# Patient Record
Sex: Female | Born: 1980 | Race: White | Hispanic: No | Marital: Married | Smoking: Never smoker
Health system: Southern US, Community
[De-identification: ages and names within clinical notes are randomized; demographics above are authoritative.]

## PROBLEM LIST (undated history)

## (undated) DIAGNOSIS — Z9889 Other specified postprocedural states: Secondary | ICD-10-CM

## (undated) DIAGNOSIS — E079 Disorder of thyroid, unspecified: Secondary | ICD-10-CM

## (undated) DIAGNOSIS — E039 Hypothyroidism, unspecified: Secondary | ICD-10-CM

## (undated) DIAGNOSIS — O24419 Gestational diabetes mellitus in pregnancy, unspecified control: Secondary | ICD-10-CM

## (undated) DIAGNOSIS — R112 Nausea with vomiting, unspecified: Secondary | ICD-10-CM

## (undated) HISTORY — PX: ARM WOUND REPAIR / CLOSURE: SUR1141

## (undated) HISTORY — DX: Disorder of thyroid, unspecified: E07.9

---

## 2008-10-06 ENCOUNTER — Ambulatory Visit (HOSPITAL_COMMUNITY): Admission: EM | Admit: 2008-10-06 | Discharge: 2008-10-07 | Payer: Self-pay | Admitting: Emergency Medicine

## 2009-04-29 ENCOUNTER — Ambulatory Visit: Payer: Self-pay | Admitting: General Practice

## 2009-10-13 ENCOUNTER — Emergency Department (HOSPITAL_BASED_OUTPATIENT_CLINIC_OR_DEPARTMENT_OTHER): Admission: EM | Admit: 2009-10-13 | Discharge: 2009-10-13 | Payer: Self-pay | Admitting: Emergency Medicine

## 2011-02-21 LAB — CBC
MCHC: 34.2 g/dL (ref 30.0–36.0)
Platelets: 243 10*3/uL (ref 150–400)
RBC: 4.66 MIL/uL (ref 3.87–5.11)
WBC: 10.9 10*3/uL — ABNORMAL HIGH (ref 4.0–10.5)

## 2011-02-21 LAB — BASIC METABOLIC PANEL
Calcium: 9.3 mg/dL (ref 8.4–10.5)
Creatinine, Ser: 0.7 mg/dL (ref 0.4–1.2)
GFR calc Af Amer: >60 mL/min (ref >60)
GFR calc non Af Amer: >60 mL/min (ref >60)
Glucose, Bld: 93 mg/dL (ref 70–99)
Sodium: 140 mEq/L (ref 135–145)

## 2011-02-21 LAB — URINALYSIS, ROUTINE W REFLEX MICROSCOPIC
Bilirubin Urine: NEGATIVE
Ketones, ur: NEGATIVE mg/dL
Urobilinogen, UA: 0.2 mg/dL (ref 0.0–1.0)
pH: 5.5 (ref 5.0–8.0)

## 2011-02-21 LAB — DIFFERENTIAL
Basophils Absolute: 0.4 10*3/uL — ABNORMAL HIGH (ref 0.0–0.1)
Basophils Relative: 4 % — ABNORMAL HIGH (ref 0–1)
Lymphocytes Relative: 14 % (ref 12–46)
Neutro Abs: 8.2 10*3/uL — ABNORMAL HIGH (ref 1.7–7.7)

## 2011-02-21 LAB — URINE MICROSCOPIC-ADD ON

## 2011-04-03 NOTE — Op Note (Signed)
Sabrina Richards, Sabrina Richards NO.:  [id number]   MEDICAL RECORD NO.:  [id number]          PATIENT TYPE:  INP   LOCATION:  0098                         FACILITY:  [facility]   PHYSICIAN:  Vanita Panda. Magnus Ivan, M.D.DATE OF BIRTH:  1980-12-20   DATE OF PROCEDURE:  10/06/2008  DATE OF DISCHARGE:                               OPERATIVE REPORT   PREOPERATIVE NOTE:  Right arm multiple, deep, soft tissue wounds, status  post dog bite.   POSTOPERATIVE DIAGNOSIS:  Right arm multiple dog bite, deep, soft tissue  wounds with exposed posterior elbow joint.   PROCEDURE:  1. Irrigation and debridement of multiple, deep, soft tissue wounds,      right arm.  2. Exploration of deep penetrating traumatic wounds, right arm and      elbow.  3. Primary closure of five soft tissue lacerations.   SURGEON:  Doneen Poisson, M.D.   ANESTHESIA:  General.   ANTIBIOTICS:  1. Avelox IV.  2. Clindamycin IV.   Both given in the ER.   BLOOD LOSS:  10 mL.   COMPLICATIONS:  None.   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  She was seen in the emergency room and found to have two  to three large wounds on the anterior medial aspect of the distal  humerus near the elbow joint.  She also had smaller wounds on the  lateral aspect.  Her neurovascular exam was normal.  She did report some  slight numbness in the dorsum of her hand and a cool-feeling hand, but  she had a palpable pulse, well-perfused fingers and normal motor  function.  X-rays were obtained that did show air penetrating all the  way down to the joint and it was recommend  given the nature of her  wounds.  She undergo irrigation debridement with exploration of the  wound, repair of any injured structures and irrigation of the elbow  joint as well.  Risks and benefits of this were explained to her at  length  and she agreed to surgery.   PROCEDURE:  After informed consent was obtained, appropriate right arm  was marked.  She was brought to the operating room, placed supine on the  operating room table.  Her right arm was placed on the arm table.  General anesthesia was then obtained.  Her arm was prepped and draped  from the axilla to the wrist with Betadine scrub and paint.  A sterile  stockinette was utilized as well as sterile drapes.  Time-out was called  to identify the correct patient correct right arm.   I then started first at the large medial wounds.  I could palpate easily  the medial epicondyle and the ulnar nerve.  I could further palpate the  olecranon fossa to find out the joint was exposed.  It was stable  ligamentously.  Next using a soft tissue dissecting scissors,  I removed  necrotic fatty tissue and necrotic skin edges.  I  then used pulsatile  lavage and thoroughly irrigated the deep tissues all around the medial  aspect and posterior aspect of the elbow using 3 liters of normal saline  solution followed by 500 mL of bacitracin solution.  Small wounds on the  lateral aspect of the elbow were likewise irrigated thoroughly with  pulsatile lavage.  I then closed all wounds separately using interrupted  3-0 nylon suture in a loose format to bring the skin edges together to  allow for drainage in case of further infection.  Five wounds total were  closed.  I then placed Xeroform followed by well-padded sterile  dressing, soft Kling and Ace wrap.  The patient's hand remained well-  perfused throughout the case.  At the end of the case, blood loss was 10  mL.  All final counts were correct and there no complications noted.  The patient was awakened, extubated taken to recovery room in stable  condition.   Postoperatively she will be admitted for extended outpatient recovery in  23-hour observation for administration of IV antibiotics before  discharge in transition to oral  antibiotics.  A handwritten history and  exam can also be seen in her chart.      Vanita Panda. Magnus Ivan, M.D.  Electronically Signed     CYB/MEDQ  D:  10/06/2008  T:  10/07/2008  Job:  [id number]

## 2011-08-22 LAB — CBC
MCHC: 34.3
MCV: 93.9
Platelets: 233
RBC: 4.25
WBC: 14 — ABNORMAL HIGH

## 2011-08-22 LAB — COMPREHENSIVE METABOLIC PANEL
ALT: 13
Albumin: 3.2 — ABNORMAL LOW
Calcium: 8.6
Chloride: 108
Creatinine, Ser: 0.65
Total Protein: 6

## 2016-01-31 ENCOUNTER — Encounter: Payer: Self-pay | Admitting: Dietician

## 2016-01-31 ENCOUNTER — Encounter: Payer: 59 | Attending: Advanced Practice Midwife | Admitting: Dietician

## 2016-01-31 VITALS — BP 108/62 | Ht 60.0 in | Wt 263.5 lb

## 2016-01-31 DIAGNOSIS — O24419 Gestational diabetes mellitus in pregnancy, unspecified control: Secondary | ICD-10-CM | POA: Insufficient documentation

## 2016-01-31 DIAGNOSIS — O24410 Gestational diabetes mellitus in pregnancy, diet controlled: Secondary | ICD-10-CM

## 2016-01-31 NOTE — Progress Notes (Signed)
Diabetes Self-Management Education  Visit Type: First/Initial  Appt. Start Time: 1300 Appt. End Time: 1430  01/31/2016  Sabrina Richards, identified by name and date of birth, is a 35 y.o. female with a diagnosis of Diabetes: Gestational Diabetes.   ASSESSMENT  Blood pressure 108/62, height 5' (1.524 m), weight 263 lb 8 oz (119.523 kg), last menstrual period 06/07/2015. Body mass index is 51.46 kg/(m^2).  Obesity BG results mostly elevated      Diabetes Self-Management Education - 01/31/16 1547    Visit Information   Visit Type First/Initial   Initial Visit   Diabetes Type Gestational Diabetes   Health Coping   How would you rate your overall health? Good   Psychosocial Assessment   Patient Belief/Attitude about Diabetes Motivated to manage diabetes   Self-care barriers None   Patient Concerns Weight Control;Glycemic Control  lose weight after pregnancy   Special Needs None   Preferred Learning Style Hands on;Visual;Auditory   Learning Readiness Ready   What is the last grade level you completed in school? 16   Complications   How often do you check your blood sugar? --  4x/day   Fasting Blood glucose range (mg/dL) 45-409  811-914   Postprandial Blood glucose range (mg/dL) --  recent results 782-956   Have you had a dilated eye exam in the past 12 months? No  ?5 years ago   Have you had a dental exam in the past 12 months? Yes   Are you checking your feet? No   Dietary Intake   Breakfast --  eats breakfast at 6:30-8A; eats 3 meals and 3 snacks/day (meal and snack times vary)   Snack (morning) --  eats snack foods and desserts (low carb)  2-3x/wk.   Lunch --  eats lunch 11-1p   Dinner --  eats supper 6-7p   Beverage(s) --  drinks 8+ glasses of water/day and 2-3 sugar free drinks/day   Exercise   Exercise Type --  walks 30 min 3x/wk   How many days per week to you exercise? 3   How many minutes per day do you exercise? 30   Total minutes per week of exercise  90   Patient Education   Previous Diabetes Education Yes (please comment)  history of GDM with last pregnancy   Disease state  --  discussed pahtophysiology of GDM and treatment options   Nutrition management  Role of diet in the treatment of diabetes and the relationship between the three main macronutrients and blood glucose level;Food label reading, portion sizes and measuring food.;Carbohydrate counting   Physical activity and exercise  --  role of exercise to promote BG control-encouraged to walk at least 5x/wk if permitted by MD   Medications Reviewed patients medication for diabetes, action, purpose, timing of dose and side effects.  discussed  that pt will require medication for BG control-mostly likely insulin   Monitoring Purpose and frequency of SMBG.;Taught/discussed recording of test results and interpretation of SMBG.  reviewed use of Ultra One Touch meter   Acute complications Discussed and identified patients' treatment of hyperglycemia.   Psychosocial adjustment Role of stress on diabetes   Preconception care Pregnancy and GDM  Role of pre-pregnancy blood glucose control on the development of the fetus;Role of family planning for patients with diabetes;Reviewed with patient blood glucose goals with pregnancy  discussed importance of tight BG control to lower risk of complications with pregnancy   Personal strategies to promote health Lifestyle issues that need to be  addressed for better diabetes care      Individualized Plan for Diabetes Self-Management Training:   Learning Objective:  Patient will have a greater understanding of diabetes self-management. Patient education plan is to attend individual and/or group sessions per assessed needs and concerns.   Plan:   Patient Instructions  Read booklet on Gestational Diabetes Follow Gestational Meal Planning Guidelines Complete a 3 Day Food Record and bring to next appointment Check blood sugars 4 x day - before  breakfast and 2 hrs after every meal and record  Bring blood sugar log to all appointments Walk 20-30 minutes at least 5 x week if permitted by MD Next appointment    02-07-16 Call if questions arise   Expected Outcomes:   positive  Education material provided: General Meal Planning Guidelines for Healthy Pregnancy, GDM booklet, GDM video  If problems or questions, patient to contact team via:  [phone]  Future DSME appointment:

## 2016-01-31 NOTE — Patient Instructions (Signed)
Read booklet on Gestational Diabetes Follow Gestational Meal Planning Guidelines Complete a 3 Day Food Record and bring to next appointment Check blood sugars 4 x day - before breakfast and 2 hrs after every meal and record  Bring blood sugar log to all appointments Walk 20-30 minutes at least 5 x week if permitted by MD Next appointment    02-07-16 Call if questions arise

## 2016-02-07 ENCOUNTER — Encounter: Payer: 59 | Admitting: Dietician

## 2016-02-07 ENCOUNTER — Encounter: Payer: Self-pay | Admitting: Dietician

## 2016-02-07 VITALS — BP 110/68 | Ht 60.0 in | Wt 259.8 lb

## 2016-02-07 DIAGNOSIS — O24410 Gestational diabetes mellitus in pregnancy, diet controlled: Secondary | ICD-10-CM

## 2016-02-07 DIAGNOSIS — O24419 Gestational diabetes mellitus in pregnancy, unspecified control: Secondary | ICD-10-CM | POA: Diagnosis not present

## 2016-02-07 NOTE — Patient Instructions (Signed)
Continue with current eating and activity pattern. 

## 2016-02-07 NOTE — Progress Notes (Signed)
   Patient's BG record indicates FBGs above goal: ranging 101-122mg /dl; and post-meal BGs generally within range: 99-127, + one reading of 132mg /dl.  Patient's food diary indicates healthy food choices and good control of carb intake. She is including protein sources with all meals and snacks. She is avoiding sweets and sugar-sweetened drinks.   Provided 1700kcal meal plan, patient voices understanding of Diabetes meal planning, and does not feel she needs food lists or sample menus.  Instructed patient on food safety, including avoidance of Listeriosis, and limiting mercury from fish.  Discussed importance of maintaining healthy lifestyle habits to reduce risk of Type 2 DM as well as Gestational DM with any future pregnancies.  Advised patient to use any remaining testing supplies to test some BGs after delivery, and to have BG tested ideally annually, as well as prior to attempting future pregnancies.

## 2016-02-16 ENCOUNTER — Ambulatory Visit: Payer: 59

## 2016-02-22 ENCOUNTER — Other Ambulatory Visit: Payer: Self-pay

## 2016-02-22 DIAGNOSIS — O3660X1 Maternal care for excessive fetal growth, unspecified trimester, fetus 1: Secondary | ICD-10-CM

## 2016-02-23 ENCOUNTER — Ambulatory Visit
Admission: RE | Admit: 2016-02-23 | Discharge: 2016-02-23 | Disposition: A | Discharge status: Home / Self Care | Payer: 59 | Source: Ambulatory Visit | Attending: Maternal & Fetal Medicine | Admitting: Maternal & Fetal Medicine

## 2016-02-23 DIAGNOSIS — O3663X1 Maternal care for excessive fetal growth, third trimester, fetus 1: Secondary | ICD-10-CM | POA: Insufficient documentation

## 2016-02-23 DIAGNOSIS — Z36 Encounter for antenatal screening of mother: Secondary | ICD-10-CM | POA: Diagnosis present

## 2016-02-23 DIAGNOSIS — O24419 Gestational diabetes mellitus in pregnancy, unspecified control: Secondary | ICD-10-CM | POA: Insufficient documentation

## 2016-02-23 DIAGNOSIS — Z3A33 33 weeks gestation of pregnancy: Secondary | ICD-10-CM | POA: Insufficient documentation

## 2016-02-23 DIAGNOSIS — O3660X1 Maternal care for excessive fetal growth, unspecified trimester, fetus 1: Secondary | ICD-10-CM

## 2016-02-23 HISTORY — DX: Hypothyroidism, unspecified: E03.9

## 2016-02-23 HISTORY — DX: Gestational diabetes mellitus in pregnancy, unspecified control: O24.419

## 2016-03-21 ENCOUNTER — Encounter
Admission: RE | Admit: 2016-03-21 | Discharge: 2016-03-21 | Disposition: A | Discharge status: Home / Self Care | Payer: 59 | Source: Ambulatory Visit | Attending: Obstetrics & Gynecology | Admitting: Obstetrics & Gynecology

## 2016-03-21 LAB — DIFFERENTIAL
BASOS ABS: 0 10*3/uL (ref 0–0.1)
Basophils Relative: 0 %
Eosinophils Absolute: 0 10*3/uL (ref 0–0.7)
Eosinophils Relative: 1 %
LYMPHS ABS: 1.8 10*3/uL (ref 1.0–3.6)
Monocytes Absolute: 0.8 10*3/uL (ref 0.2–0.9)
Monocytes Relative: 10 %
Neutro Abs: 5.3 10*3/uL (ref 1.4–6.5)

## 2016-03-21 LAB — CBC
HEMATOCRIT: 40.7 % (ref 35.0–47.0)
HEMOGLOBIN: 14.3 g/dL (ref 12.0–16.0)
MCH: 33.6 pg (ref 26.0–34.0)
MCHC: 35.1 g/dL (ref 32.0–36.0)
MCV: 95.7 fL (ref 80.0–100.0)
Platelets: 125 10*3/uL — ABNORMAL LOW (ref 150–440)
RBC: 4.26 MIL/uL (ref 3.80–5.20)
RDW: 14.1 % (ref 11.5–14.5)
WBC: 8 10*3/uL (ref 3.6–11.0)

## 2016-03-21 LAB — ABO/RH: ABO/RH(D): O NEG

## 2016-03-21 NOTE — Patient Instructions (Signed)
  Your procedure is scheduled on: Mar 22, 2016 (Thursday) Report to EMERGENCY DEPARTMENT. To find out your arrival time please call [phone] between 1PM - 3PM on ARRIVAL TIME 5:30 AM.  Remember: Instructions that are not followed completely may result in serious medical risk, up to and including death, or upon the discretion of your surgeon and anesthesiologist your surgery may need to be rescheduled.    __x__ 1. Do not eat food or drink liquids after midnight. No gum chewing or hard candies.     ____ 2. No Alcohol for 24 hours before or after surgery.   ____ 3. Bring all medications with you on the day of surgery if instructed.    __x__ 4. Notify your doctor if there is any change in your medical condition     (cold, fever, infections).     Do not wear jewelry, make-up, hairpins, clips or nail polish.  Do not wear lotions, powders, or perfumes. You may wear deodorant.  Do not shave 48 hours prior to surgery. Men may shave face and neck.  Do not bring valuables to the hospital.    [facility] is not responsible for any belongings or valuables.               Contacts, dentures or bridgework may not be worn into surgery.  Leave your suitcase in the car. After surgery it may be brought to your room.  For patients admitted to the hospital, discharge time is determined by your                treatment team.   Patients discharged the day of surgery will not be allowed to drive home.   Please read over the following fact sheets that you were given:   Surgical Site Infection Prevention   ____ Take these medicines the morning of surgery with A SIP OF WATER:    1.    .  ____ Fleet Enema (as directed)   __x__ Use CHG Soap as directed (SAGE WIPES)  ____ Use inhalers on the day of surgery  ____ Stop metformin 2 days prior to surgery    ____ Take 1/2 of usual insulin dose the night before surgery and none on the morning of surgery.   __x__ Stop Coumadin/Plavix/aspirin on (NO  ASPIRIN)  __x__ Stop Anti-inflammatories on (NO NSAIDS)   ____ Stop supplements until after surgery.    ____ Bring C-Pap to the hospital.

## 2016-03-22 ENCOUNTER — Inpatient Hospital Stay: Payer: 59 | Admitting: Anesthesiology

## 2016-03-22 ENCOUNTER — Inpatient Hospital Stay
Admission: RE | Admit: 2016-03-22 | Discharge: 2016-03-24 | DRG: 765 | Disposition: A | Discharge status: Home / Self Care | Payer: 59 | Source: Ambulatory Visit | Attending: Obstetrics & Gynecology | Admitting: Obstetrics & Gynecology

## 2016-03-22 ENCOUNTER — Encounter
Admission: RE | Disposition: A | Discharge status: Home / Self Care | Payer: Self-pay | Source: Ambulatory Visit | Attending: Obstetrics & Gynecology

## 2016-03-22 DIAGNOSIS — O99214 Obesity complicating childbirth: Secondary | ICD-10-CM | POA: Diagnosis present

## 2016-03-22 DIAGNOSIS — Z3A37 37 weeks gestation of pregnancy: Secondary | ICD-10-CM

## 2016-03-22 DIAGNOSIS — Z6841 Body Mass Index (BMI) 40.0 and over, adult: Secondary | ICD-10-CM | POA: Diagnosis not present

## 2016-03-22 DIAGNOSIS — O34219 Maternal care for unspecified type scar from previous cesarean delivery: Secondary | ICD-10-CM | POA: Diagnosis present

## 2016-03-22 DIAGNOSIS — E669 Obesity, unspecified: Secondary | ICD-10-CM | POA: Diagnosis present

## 2016-03-22 DIAGNOSIS — O99284 Endocrine, nutritional and metabolic diseases complicating childbirth: Secondary | ICD-10-CM | POA: Diagnosis present

## 2016-03-22 DIAGNOSIS — O24414 Gestational diabetes mellitus in pregnancy, insulin controlled: Secondary | ICD-10-CM | POA: Diagnosis present

## 2016-03-22 DIAGNOSIS — O99210 Obesity complicating pregnancy, unspecified trimester: Secondary | ICD-10-CM | POA: Diagnosis present

## 2016-03-22 DIAGNOSIS — E039 Hypothyroidism, unspecified: Secondary | ICD-10-CM | POA: Diagnosis present

## 2016-03-22 DIAGNOSIS — O34211 Maternal care for low transverse scar from previous cesarean delivery: Secondary | ICD-10-CM | POA: Diagnosis present

## 2016-03-22 DIAGNOSIS — O26899 Other specified pregnancy related conditions, unspecified trimester: Secondary | ICD-10-CM

## 2016-03-22 DIAGNOSIS — O24415 Gestational diabetes mellitus in pregnancy, controlled by oral hypoglycemic drugs: Secondary | ICD-10-CM | POA: Diagnosis present

## 2016-03-22 DIAGNOSIS — O3663X0 Maternal care for excessive fetal growth, third trimester, not applicable or unspecified: Secondary | ICD-10-CM | POA: Diagnosis present

## 2016-03-22 DIAGNOSIS — Z6791 Unspecified blood type, Rh negative: Secondary | ICD-10-CM | POA: Diagnosis present

## 2016-03-22 DIAGNOSIS — O0993 Supervision of high risk pregnancy, unspecified, third trimester: Secondary | ICD-10-CM

## 2016-03-22 DIAGNOSIS — O24425 Gestational diabetes mellitus in childbirth, controlled by oral hypoglycemic drugs: Secondary | ICD-10-CM | POA: Diagnosis present

## 2016-03-22 DIAGNOSIS — O99280 Endocrine, nutritional and metabolic diseases complicating pregnancy, unspecified trimester: Secondary | ICD-10-CM

## 2016-03-22 HISTORY — DX: 37 weeks gestation of pregnancy: Z3A.37

## 2016-03-22 HISTORY — DX: Supervision of high risk pregnancy, unspecified, third trimester: O09.93

## 2016-03-22 LAB — CREATININE, SERUM
Creatinine, Ser: 0.48 mg/dL (ref 0.44–1.00)
GFR calc non Af Amer: >60 mL/min (ref >60)

## 2016-03-22 LAB — HIV ANTIBODY (ROUTINE TESTING W REFLEX): HIV Screen 4th Generation wRfx: NONREACTIVE

## 2016-03-22 LAB — GLUCOSE, CAPILLARY: Glucose-Capillary: 76 mg/dL (ref 65–99)

## 2016-03-22 LAB — TYPE AND SCREEN
ABO/RH(D): O NEG
ANTIBODY SCREEN: POSITIVE
Extend sample reason: UNDETERMINED

## 2016-03-22 LAB — RPR: RPR: NONREACTIVE

## 2016-03-22 SURGERY — Surgical Case
Anesthesia: Spinal | Wound class: Clean Contaminated

## 2016-03-22 MED ORDER — BUPIVACAINE 0.25 % ON-Q PUMP DUAL CATH 400 ML
INJECTION | Status: AC
  Filled 2016-03-22: qty 400

## 2016-03-22 MED ORDER — SODIUM CHLORIDE 0.9 % IJ SOLN
INTRAMUSCULAR | Status: AC
  Filled 2016-03-22: qty 50

## 2016-03-22 MED ORDER — OXYTOCIN 10 UNIT/ML IJ SOLN
40.0000 [IU] | INTRAVENOUS | Status: DC | PRN
  Administered 2016-03-22 (×2): 40 [IU] via INTRAVENOUS

## 2016-03-22 MED ORDER — IBUPROFEN 600 MG PO TABS
600.0000 mg | ORAL_TABLET | Freq: Four times a day (QID) | ORAL | Status: DC
  Administered 2016-03-22 – 2016-03-24 (×7): 600 mg via ORAL
  Filled 2016-03-22 (×8): qty 1

## 2016-03-22 MED ORDER — FENTANYL CITRATE (PF) 100 MCG/2ML IJ SOLN
INTRAMUSCULAR | Status: DC | PRN
  Administered 2016-03-22: 15 ug via INTRATHECAL

## 2016-03-22 MED ORDER — DIPHENHYDRAMINE HCL 25 MG PO CAPS: 25.0000 mg | ORAL_CAPSULE | ORAL | Status: DC | PRN

## 2016-03-22 MED ORDER — ONDANSETRON HCL 4 MG/2ML IJ SOLN: 4.0000 mg | Freq: Three times a day (TID) | INTRAMUSCULAR | Status: DC | PRN

## 2016-03-22 MED ORDER — KETOROLAC TROMETHAMINE 30 MG/ML IJ SOLN: 30.0000 mg | Freq: Four times a day (QID) | INTRAMUSCULAR | Status: DC | PRN

## 2016-03-22 MED ORDER — EPHEDRINE SULFATE 50 MG/ML IJ SOLN
INTRAMUSCULAR | Status: DC | PRN
  Administered 2016-03-22: 5 mg via INTRAVENOUS

## 2016-03-22 MED ORDER — WITCH HAZEL-GLYCERIN EX PADS: 1.0000 "application " | MEDICATED_PAD | CUTANEOUS | Status: DC | PRN

## 2016-03-22 MED ORDER — BUPIVACAINE HCL (PF) 0.5 % IJ SOLN
30.0000 mL | Freq: Once | INTRAMUSCULAR | Status: DC
  Filled 2016-03-22 (×2): qty 30

## 2016-03-22 MED ORDER — BUPIVACAINE LIPOSOME 1.3 % IJ SUSP
INTRAMUSCULAR | Status: DC | PRN
  Administered 2016-03-22: 09:00:00

## 2016-03-22 MED ORDER — MENTHOL 3 MG MT LOZG: 1.0000 | LOZENGE | OROMUCOSAL | Status: DC | PRN

## 2016-03-22 MED ORDER — SODIUM CHLORIDE 0.9% FLUSH: 3.0000 mL | Freq: Two times a day (BID) | INTRAVENOUS | Status: DC

## 2016-03-22 MED ORDER — DIBUCAINE 1 % RE OINT: 1.0000 "application " | TOPICAL_OINTMENT | RECTAL | Status: DC | PRN

## 2016-03-22 MED ORDER — PRENATAL MULTIVITAMIN CH
1.0000 | ORAL_TABLET | Freq: Every day | ORAL | Status: DC
  Administered 2016-03-23: 1 via ORAL
  Filled 2016-03-22: qty 1

## 2016-03-22 MED ORDER — COCONUT OIL OIL: 1.0000 "application " | TOPICAL_OIL | Status: DC | PRN

## 2016-03-22 MED ORDER — MORPHINE SULFATE (PF) 0.5 MG/ML IJ SOLN
INTRAMUSCULAR | Status: DC | PRN
  Administered 2016-03-22: .1 mg via INTRATHECAL

## 2016-03-22 MED ORDER — OXYTOCIN 10 UNIT/ML IJ SOLN: 2.5000 [IU]/h | INTRAVENOUS | Status: DC

## 2016-03-22 MED ORDER — LACTATED RINGERS IV SOLN
INTRAVENOUS | Status: DC
  Administered 2016-03-22: 08:00:00 via INTRAVENOUS

## 2016-03-22 MED ORDER — ONDANSETRON HCL 4 MG/2ML IJ SOLN
INTRAMUSCULAR | Status: AC
  Administered 2016-03-22: 4 mg
  Filled 2016-03-22: qty 2

## 2016-03-22 MED ORDER — DIPHENHYDRAMINE HCL 50 MG/ML IJ SOLN: 12.5000 mg | INTRAMUSCULAR | Status: DC | PRN

## 2016-03-22 MED ORDER — METHYLERGONOVINE MALEATE 0.2 MG/ML IJ SOLN
INTRAMUSCULAR | Status: AC
Start: 2016-03-22 — End: 2016-03-22
  Administered 2016-03-22: 0.2 mg via INTRAMUSCULAR
  Filled 2016-03-22: qty 1

## 2016-03-22 MED ORDER — BUPIVACAINE LIPOSOME 1.3 % IJ SUSP: INTRAMUSCULAR | Status: DC | PRN

## 2016-03-22 MED ORDER — ACETAMINOPHEN 650 MG RE SUPP
650.0000 mg | Freq: Once | RECTAL | Status: AC
  Administered 2016-03-22: 650 mg via RECTAL
  Filled 2016-03-22: qty 1

## 2016-03-22 MED ORDER — ENOXAPARIN SODIUM 40 MG/0.4ML ~~LOC~~ SOLN
40.0000 mg | Freq: Two times a day (BID) | SUBCUTANEOUS | Status: DC
  Administered 2016-03-22 – 2016-03-24 (×4): 40 mg via SUBCUTANEOUS
  Filled 2016-03-22 (×4): qty 0.4

## 2016-03-22 MED ORDER — HYDROCODONE-ACETAMINOPHEN 5-325 MG PO TABS
1.0000 | ORAL_TABLET | ORAL | Status: DC | PRN
  Administered 2016-03-23: 1 via ORAL
  Administered 2016-03-23: 2 via ORAL
  Administered 2016-03-23 – 2016-03-24 (×4): 1 via ORAL
  Filled 2016-03-22 (×2): qty 1
  Filled 2016-03-22: qty 2
  Filled 2016-03-22: qty 1
  Filled 2016-03-22 (×2): qty 2

## 2016-03-22 MED ORDER — BUPIVACAINE HCL (PF) 0.5 % IJ SOLN
INTRAMUSCULAR | Status: AC
  Filled 2016-03-22: qty 30

## 2016-03-22 MED ORDER — LACTATED RINGERS IV SOLN
INTRAVENOUS | Status: DC
  Administered 2016-03-22 – 2016-03-23 (×2): via INTRAVENOUS

## 2016-03-22 MED ORDER — ONDANSETRON HCL 4 MG/2ML IJ SOLN
INTRAMUSCULAR | Status: DC | PRN
  Administered 2016-03-22: 4 mg via INTRAVENOUS

## 2016-03-22 MED ORDER — BUPIVACAINE HCL (PF) 0.5 % IJ SOLN
INTRAMUSCULAR | Status: DC | PRN
  Administered 2016-03-22: 20 mL

## 2016-03-22 MED ORDER — SIMETHICONE 80 MG PO CHEW
160.0000 mg | CHEWABLE_TABLET | Freq: Four times a day (QID) | ORAL | Status: DC | PRN
  Administered 2016-03-23: 160 mg via ORAL
  Filled 2016-03-22: qty 2

## 2016-03-22 MED ORDER — LACTATED RINGERS IV SOLN
Freq: Once | INTRAVENOUS | Status: AC
  Administered 2016-03-22: 06:00:00 via INTRAVENOUS

## 2016-03-22 MED ORDER — ACETAMINOPHEN 325 MG PO TABS: 650.0000 mg | ORAL_TABLET | ORAL | Status: DC | PRN

## 2016-03-22 MED ORDER — BUPIVACAINE LIPOSOME 1.3 % IJ SUSP
20.0000 mL | Freq: Once | INTRAMUSCULAR | Status: DC
  Filled 2016-03-22 (×2): qty 20

## 2016-03-22 MED ORDER — PHENYLEPHRINE HCL 10 MG/ML IJ SOLN
INTRAMUSCULAR | Status: DC | PRN
  Administered 2016-03-22 (×4): 100 ug via INTRAVENOUS

## 2016-03-22 MED ORDER — DIPHENHYDRAMINE HCL 25 MG PO CAPS: 25.0000 mg | ORAL_CAPSULE | Freq: Four times a day (QID) | ORAL | Status: DC | PRN

## 2016-03-22 MED ORDER — CITRIC ACID-SODIUM CITRATE 334-500 MG/5ML PO SOLN
ORAL | Status: AC
  Administered 2016-03-22: 30 mL
  Filled 2016-03-22: qty 15

## 2016-03-22 MED ORDER — BUPIVACAINE IN DEXTROSE 0.75-8.25 % IT SOLN
INTRATHECAL | Status: DC | PRN
  Administered 2016-03-22: 1.4 mL via INTRATHECAL

## 2016-03-22 MED ORDER — CEFAZOLIN SODIUM-DEXTROSE 2-4 GM/100ML-% IV SOLN
INTRAVENOUS | Status: AC
  Administered 2016-03-22: 3 g
  Filled 2016-03-22: qty 100

## 2016-03-22 MED ORDER — BUPIVACAINE LIPOSOME 1.3 % IJ SUSP
INTRAMUSCULAR | Status: AC
  Filled 2016-03-22: qty 20

## 2016-03-22 SURGICAL SUPPLY — 42 items
CANISTER SUCT 3000ML (MISCELLANEOUS) ×3 IMPLANT
CATH KIT ON-Q SILVERSOAK 5IN (CATHETERS) ×6 IMPLANT
CLOSURE WOUND 1/2 X4 (GAUZE/BANDAGES/DRESSINGS)
DRSG TELFA 3X8 NADH (GAUZE/BANDAGES/DRESSINGS) IMPLANT
ELECT CAUTERY BLADE 6.4 (BLADE) ×3 IMPLANT
ELECT REM PT RETURN 9FT ADLT (ELECTROSURGICAL) ×3
ELECTRODE REM PT RTRN 9FT ADLT (ELECTROSURGICAL) ×1 IMPLANT
GAUZE SPONGE 4X4 12PLY STRL (GAUZE/BANDAGES/DRESSINGS) IMPLANT
GLOVE BIOGEL PI IND STRL 6.5 (GLOVE) ×1 IMPLANT
GLOVE BIOGEL PI INDICATOR 6.5 (GLOVE) ×2
GLOVE SURG SYN 6.5 ES PF (GLOVE) ×30 IMPLANT
GOWN STRL REUS W/ TWL LRG LVL3 (GOWN DISPOSABLE) ×5 IMPLANT
GOWN STRL REUS W/TWL LRG LVL3 (GOWN DISPOSABLE) ×10
KIT PREVENA INCISION MGT20CM45 (CANNISTER) ×3 IMPLANT
LIQUID BAND (GAUZE/BANDAGES/DRESSINGS) ×3 IMPLANT
NDL SAFETY 25GX1.5 (NEEDLE) ×3 IMPLANT
NEEDLE FILTER BLUNT 18X 1/2SAF (NEEDLE) ×4
NEEDLE FILTER BLUNT 18X1 1/2 (NEEDLE) ×2 IMPLANT
NS IRRIG 1000ML POUR BTL (IV SOLUTION) ×3 IMPLANT
PACK C SECTION AR (MISCELLANEOUS) ×3 IMPLANT
PAD OB MATERNITY 4.3X12.25 (PERSONAL CARE ITEMS) ×3 IMPLANT
PAD PREP 24X41 OB/GYN DISP (PERSONAL CARE ITEMS) ×3 IMPLANT
RETRACTOR TRAXI PANNICULUS (MISCELLANEOUS) ×1 IMPLANT
RTRCTR C-SECT PINK 34CM XLRG (MISCELLANEOUS) ×3 IMPLANT
SPONGE LAP 18X18 5 PK (GAUZE/BANDAGES/DRESSINGS) ×6 IMPLANT
STRAP SAFETY BODY (MISCELLANEOUS) ×3 IMPLANT
STRIP CLOSURE SKIN 1/2X4 (GAUZE/BANDAGES/DRESSINGS) IMPLANT
SUT MNCRL 4-0 (SUTURE) ×2
SUT MNCRL 4-0 27XMFL (SUTURE) ×1
SUT PDS AB 1 TP1 96 (SUTURE) ×3 IMPLANT
SUT PLAIN 2 0 XLH (SUTURE) ×3 IMPLANT
SUT VIC AB 0 CT1 36 (SUTURE) ×12 IMPLANT
SUT VIC AB 0 CTX 36 (SUTURE) ×2
SUT VIC AB 0 CTX36XBRD ANBCTRL (SUTURE) ×1 IMPLANT
SUT VIC AB 2-0 CT1 27 (SUTURE)
SUT VIC AB 2-0 CT1 TAPERPNT 27 (SUTURE) IMPLANT
SUT VIC AB 3-0 SH 27 (SUTURE) ×2
SUT VIC AB 3-0 SH 27X BRD (SUTURE) ×1 IMPLANT
SUTURE MNCRL 4-0 27XMF (SUTURE) ×1 IMPLANT
SWABSTK COMLB BENZOIN TINCTURE (MISCELLANEOUS) ×3 IMPLANT
SYR 30ML LL (SYRINGE) ×9 IMPLANT
TRAXI PANNICULUS RETRACTOR (MISCELLANEOUS) ×2

## 2016-03-22 NOTE — Discharge Summary (Signed)
Obstetrical Discharge Summary  Patient Name: Sabrina Richards DOB: 01/21/1981 MRN: [id number]  Date of Admission: 03/22/2016 Date of Discharge: 03/24/2016  Primary OB: [facility]  Gestational Age at Delivery: [redacted]w[redacted]d   Antepartum complications: obesity, gestational diabetes (2 - on glyburide), and suspected macrosomia Admitting Diagnosis: previous cesarean delivery, GDMA2, suspected macrosomia, Rh negative in antepartum Secondary Diagnosis: Patient Active Problem List   Diagnosis Date Noted  . Labor and delivery, indication for care 03/24/2016  . History of cesarean delivery, currently pregnant 03/22/2016  . [redacted] weeks gestation of pregnancy 03/22/2016  . Gestational diabetes mellitus (GDM) controlled on oral hypoglycemic drug, antepartum 03/22/2016  . Obesity complicating pregnancy 03/22/2016  . Rh negative, antepartum 03/22/2016  . High-risk pregnancy in third trimester 03/22/2016  . Postpartum care following cesarean delivery 03/22/2016  . Hypothyroid in pregnancy, antepartum 03/22/2016    Augmentation: n/a Complications: none apparent Intrapartum complications/course:  Patient was scheduled for cesarean delivery, presented on day of surgery.  No complications noted during surgery Date of Delivery:  03/22/16 Delivered By: Leeroy Bock Ward Delivery Type: repeat cesarean section, low transverse incision, with double layer closure Anesthesia: spinal Placenta: expressed Laceration: n/a Episiotomy: n/a Newborn Data: Live born female  Birth Weight: 8 lb 14.9 oz (4050 g) APGAR: 8,9     Discharge Physical Exam:  BP 95/62 mmHg  Pulse 64  Temp(Src) 98.4 F (36.9 C) (Oral)  Resp 18  Ht 5' (1.524 m)  Wt 117.935 kg (260 lb)  BMI 50.78 kg/m2  SpO2 96%   General: NAD CV: RRR Pulm: CTABL, nl effort ABD: s/nd/nt, fundus firm and below the umbilicus Lochia: moderate Incision: c/d/i - wound vac in place DVT Evaluation: LE non-ttp, no evidence of DVT on exam.  HEMOGLOBIN  Date Value  Ref Range Status  03/23/2016 12.2 12.0 - 16.0 g/dL Final   HCT  Date Value Ref Range Status  03/23/2016 34.8* 35.0 - 47.0 % Final    Post partum course: Routine, patient was ambulating without difficulty, pain moderate and controlled with oral meds, wound vac in place and operating, voiding spontaneously, and tolerating regular diet. Postpartum Procedures: none Disposition: stable, discharge to home. Baby Feeding: breastmilk Baby Disposition: home with mom  Rh Immune globulin given: yes Rubella vaccine given: n/a Tdap vaccine given in AP or PP setting: AP Flu vaccine given in AP or PP setting:  n/a  Contraception: TBD  Prenatal Labs:   O neg, Rubella immune, Varicella immune, Hep B neg, HIV neg, RPR non-reactive, GBS positive  Plan:  LYNNETTE POTE was discharged to home in good condition. Follow-up appointment at [facility] with Dr Elesa Massed in 1 week for removal of wound vac.   Discharge Medications:   Medication List    STOP taking these medications        cephALEXin 500 MG capsule  Commonly known as:  KEFLEX     glyBURIDE 1.25 MG tablet  Commonly known as:  DIABETA      TAKE these medications        aspirin EC 81 MG tablet  Take 81 mg by mouth daily.     HYDROcodone-acetaminophen 5-325 MG tablet  Commonly known as:  NORCO/VICODIN  Take 1-2 tablets by mouth every 4 (four) hours as needed for moderate pain or severe pain.     ibuprofen 600 MG tablet  Commonly known as:  ADVIL,MOTRIN  Take 1 tablet (600 mg total) by mouth every 6 (six) hours.     levothyroxine 50 MCG tablet  Commonly  known as:  SYNTHROID, LEVOTHROID  Take 50 mcg by mouth daily before breakfast.     PRENATAL VITAMINS PO  Take 1 Package by mouth daily.        Follow-up Information    Follow up with Elenora Fenderhelsea C Ward, MD In 5 weeks.   Specialty:  Obstetrics and Gynecology   Why:  schedule with lab for 2hr glucose tolerance test   Contact information:   [address] [place] [id number] [phone]       Follow up with Elenora Fenderhelsea C Ward, MD In 1 week.   Specialty:  Obstetrics and Gynecology   Why:  has appointment - for wound vac removal   Contact information:   [address] [place] [address] [phone]       Signed: ----- Ranae Plumberhelsea Ward, MD Attending Obstetrician and Gynecologist [facility]

## 2016-03-22 NOTE — Anesthesia Procedure Notes (Signed)
Spinal Patient location during procedure: OR Start time: 03/22/2016 7:53 AM End time: 03/22/2016 7:56 AM Staffing Anesthesiologist: Martha Clan Performed by: anesthesiologist  Preanesthetic Checklist Completed: patient identified, site marked, surgical consent, pre-op evaluation, timeout performed, IV checked, risks and benefits discussed and monitors and equipment checked Spinal Block Patient position: sitting Prep: ChloraPrep Patient monitoring: heart rate, continuous pulse ox, blood pressure and cardiac monitor Approach: midline Location: L4-5 Injection technique: single-shot Needle Needle type: Whitacre and Introducer  Needle gauge: 24 G Needle length: 9 cm Additional Notes Negative paresthesia. Negative blood return. Positive free-flowing CSF. Expiration date of kit checked and confirmed. Patient tolerated procedure well, without complications.

## 2016-03-22 NOTE — Lactation Note (Signed)
This note was copied from a baby's chart. Lactation Consultation Note  Patient Name: Sabrina Richards JXBJY'NToday's Date: 03/22/2016     Maternal Data   Mother is an experienced breast feeder and declines assistance at this time.    Gilman SchmidtCarolyn P Princeston Blizzard 03/22/2016, 11:38 AM

## 2016-03-22 NOTE — Anesthesia Preprocedure Evaluation (Signed)
Anesthesia Evaluation  Patient identified by MRN, date of birth, ID band Patient awake    Reviewed: Allergy & Precautions, H&P , NPO status , Patient's Chart, lab work & pertinent test results, reviewed documented beta blocker date and time   History of Anesthesia Complications Negative for: history of anesthetic complications  Airway Mallampati: I  TM Distance: >3 FB Neck ROM: full    Dental no notable dental hx. (+) Teeth Intact, Poor Dentition   Pulmonary neg pulmonary ROS,    Pulmonary exam normal breath sounds clear to auscultation       Cardiovascular Exercise Tolerance: Good negative cardio ROS Normal cardiovascular exam Rhythm:regular Rate:Normal     Neuro/Psych negative neurological ROS  negative psych ROS   GI/Hepatic Neg liver ROS, GERD  ,  Endo/Other  diabetesHypothyroidism Morbid obesity  Renal/GU negative Renal ROS  negative genitourinary   Musculoskeletal   Abdominal   Peds  Hematology negative hematology ROS (+)   Anesthesia Other Findings Past Medical History:   Thyroid disease                                              Gestational diabetes                                         Hypothyroidism                                               Reproductive/Obstetrics (+) Pregnancy                             Anesthesia Physical Anesthesia Plan  ASA: III  Anesthesia Plan: Spinal   Post-op Pain Management:    Induction:   Airway Management Planned:   Additional Equipment:   Intra-op Plan:   Post-operative Plan:   Informed Consent: I have reviewed the patients History and Physical, chart, labs and discussed the procedure including the risks, benefits and alternatives for the proposed anesthesia with the patient or authorized representative who has indicated his/her understanding and acceptance.   Dental Advisory Given  Plan Discussed with: Anesthesiologist,  CRNA and Surgeon  Anesthesia Plan Comments:         Anesthesia Quick Evaluation

## 2016-03-22 NOTE — Progress Notes (Signed)
Pericare performed on patient, fresh pad put in place. VSS,  [facility]. Nursery RN carried baby to nursery for bath - nursery rn [facility].

## 2016-03-22 NOTE — Op Note (Signed)
Cesarean Section Procedure Note  03/22/2016  Patient:  Sabrina Richards  35 y.o. female Preoperative diagnosis:  Term intrauterine pregnancy, prior cesarean, suspected fetal macrosomia, gestational diabetes (GDMA2) Postoperative diagnosis:  same  PROCEDURE:  Procedure(s): CESAREAN SECTION (N/A) Surgeon:  Surgeon(s) and Role:    * Chelsea C Ward, MD - Primary    * [place] Bingharlie Pickens, MD - Assisting Anesthesia:  spinal I/O: Total I/O In: 900 [I.V.:900] Out: 900 [Urine:300; Blood:600] Specimens:  Cord Blood, placenta Findings:  Live born female, Birth Weight: 8 lb 14.9 oz (4050 g) APGAR: 8,9, normal appearing tubes and ovaries, no visible scar tissue.  Complications: None Apparent Disposition:  VS stable to PACU  Indication for procedure: 34yo G3P1011 @ 37.5 with obesity, gestational diabetes controlled with glyburide, and suspected macrosomia.  Perinatology recommended delivery at 37 weeks.   Procedure Details   The risks, benefits, complications, treatment options, and expected outcomes were discussed with the patient. Informed consent was obtained. The patient was taken to Operating Room, identified as Sabrina Richards and the procedure verified as a cesarean delivery.   After administration of anesthesia, the patient was prepped and draped in the usual sterile manner, including a vaginal prep, and a pannus retractor. A surgical time out was performed, with the pediatric team present. After testing for adequacy of anesthesia, a Pfannenstiel incision was made and carried down through the subcutaneous tissue to the fascia. Fascial incision was made and extended transversely. The fascia was separated from the underlying rectus tissue superiorly and inferiorly. The peritoneum was identified and entered. Peritoneal incision was extended longitudinally.  A low transverse uterine incision was made, and copious clear amniotic fluid was encountered. Delivered from cephalic presentation was a Live born female.  Delayed cord clamping was performed for 60 seconds, and we sang Happy Birthday to baby [employer] Sinclair. The umbilical cord was doubly clamped and cut, and the baby was handed off to the awaitng pediatrician.  Cord blood was obtained for evaluation. The placenta was removed intact and appeared normal. The uterus was cleared of clots, membranes, and debris. The uterus, tubes and ovaries appeared normal. The uterine incision was closed with running locking sutures of 0 Vicryl, and then a second, imbricating stitch was placed. A third running stitch was placed for hemostasis, which was observed. The abdominal cavity was evacuated of extraneous fluid. The uterus was returned to the abdominal cavity and again the incision was inspected for hemostasis, which was confirmed.  The paracolic gutters were cleaned.   The fascia was then reapproximated with running suture of looped-PDS. After a change of gloves, the subcutaneous tissue was irrigated and reapproximated with plain gut. The skin was closed with 4-0 Monocryl.  A wound-vac was placed over top of the incision and deployed  Instrument, sponge, and needle counts were correct prior the abdominal closure and at the conclusion of the case.   I was present and performed this procedure in its entirety.  ----- Ranae Plumberhelsea Ward, MD Attending Obstetrician and Gynecologist [facility]

## 2016-03-22 NOTE — H&P (Signed)
H&P Update  PLEASE SEE PAPER H&P  Pt was last seen in my office, and complete history and physical performed.  The surgical history has been reviewed and remains accurate without interval change. The patient was re-examined and patient's physiologic condition has not changed significantly in the last 30 days.  No new pharmacological allergies or types of therapy has been initiated.  Allergies  Allergen Reactions  . Oxycodone-Acetaminophen Nausea And Vomiting    Past Medical History  Diagnosis Date  . Thyroid disease   . Gestational diabetes   . Hypothyroidism    Past Surgical History  Procedure Laterality Date  . Cesarean section    . Arm wound repair / closure      Repair of dog bite    BP 117/64 mmHg  Pulse 75  Temp(Src) 98.2 F (36.8 C) (Oral)  Resp 19  Ht 5' (1.524 m)  LMP 06/07/2015  NAD RRR no murmurs CTAB, no wheezing, resps unlabored +BS, soft, NTTP No c/c/e Pelvic exam deferred  The above history was confirmed with the patient. The condition still exists that makes this procedure necessary. Surgical plan includes Cesarean Delivery as confirmed on the consent. The treatment plan remains the same, without new options for care.  The patient understands the potential benefits and risks and the consents have been signed and placed on the chart.     Ranae Plumberhelsea Raechel Marcos, MD Attending Obstetrician Gynecologist [facility]

## 2016-03-22 NOTE — Transfer of Care (Signed)
Immediate Anesthesia Transfer of Care Note  Patient: Sabrina ArenaKelly E Zollner  Procedure(s) Performed: Procedure(s): CESAREAN SECTION (N/A)  Patient Location: PACU  Anesthesia Type:Spinal  Level of Consciousness: awake, alert  and oriented  Airway & Oxygen Therapy: Patient Spontanous Breathing  Post-op Assessment: Report given to RN and Post -op Vital signs reviewed and stable  Post vital signs: Reviewed and stable  Last Vitals:  Filed Vitals:   03/22/16 0624  BP: 117/64  Pulse: 75  Temp: 36.8 C  Resp: 19    Last Pain:  Filed Vitals:   03/22/16 0839  PainSc: 0-No pain         Complications: No apparent anesthesia complications

## 2016-03-23 LAB — CBC
HCT: 34.8 % — ABNORMAL LOW (ref 35.0–47.0)
HEMOGLOBIN: 12.2 g/dL (ref 12.0–16.0)
MCH: 33.3 pg (ref 26.0–34.0)
MCHC: 35.1 g/dL (ref 32.0–36.0)
MCV: 94.9 fL (ref 80.0–100.0)
PLATELETS: 120 10*3/uL — AB (ref 150–440)
RBC: 3.67 MIL/uL — AB (ref 3.80–5.20)
RDW: 13.9 % (ref 11.5–14.5)
WBC: 9.5 10*3/uL (ref 3.6–11.0)

## 2016-03-23 LAB — SURGICAL PATHOLOGY

## 2016-03-23 LAB — FETAL SCREEN: FETAL SCREEN: NEGATIVE

## 2016-03-23 MED ORDER — SODIUM CHLORIDE 0.9 % IV SOLN: 250.0000 mL | INTRAVENOUS | Status: DC

## 2016-03-23 MED ORDER — RHO D IMMUNE GLOBULIN 1500 UNIT/2ML IJ SOSY
300.0000 ug | PREFILLED_SYRINGE | Freq: Once | INTRAMUSCULAR | Status: AC
  Administered 2016-03-23: 300 ug via INTRAVENOUS
  Filled 2016-03-23: qty 2

## 2016-03-23 MED ORDER — NALBUPHINE HCL 10 MG/ML IJ SOLN: 5.0000 mg | Freq: Once | INTRAMUSCULAR | Status: DC | PRN

## 2016-03-23 MED ORDER — SODIUM CHLORIDE 0.9% FLUSH: 3.0000 mL | INTRAVENOUS | Status: DC | PRN

## 2016-03-23 MED ORDER — BISACODYL 10 MG RE SUPP
10.0000 mg | Freq: Every day | RECTAL | Status: DC | PRN
Start: 2016-03-23 — End: 2016-03-24
  Administered 2016-03-23: 10 mg via RECTAL
  Filled 2016-03-23: qty 1

## 2016-03-23 MED ORDER — NALBUPHINE HCL 10 MG/ML IJ SOLN: 5.0000 mg | INTRAMUSCULAR | Status: DC | PRN

## 2016-03-23 MED ORDER — NALOXONE HCL 2 MG/2ML IJ SOSY: 1.0000 ug/kg/h | PREFILLED_SYRINGE | INTRAMUSCULAR | Status: DC | PRN

## 2016-03-23 MED ORDER — LEVOTHYROXINE SODIUM 50 MCG PO TABS
50.0000 ug | ORAL_TABLET | Freq: Every day | ORAL | Status: DC
  Administered 2016-03-24: 50 ug via ORAL
  Filled 2016-03-23: qty 1

## 2016-03-23 MED ORDER — MEPERIDINE HCL 25 MG/ML IJ SOLN: 6.2500 mg | INTRAMUSCULAR | Status: DC | PRN

## 2016-03-23 MED ORDER — NALOXONE HCL 0.4 MG/ML IJ SOLN: 0.4000 mg | INTRAMUSCULAR | Status: DC | PRN

## 2016-03-23 NOTE — Anesthesia Post-op Follow-up Note (Signed)
  Anesthesia Pain Follow-up Note  Patient: Sabrina Richards  Day #: 1  Date of Follow-up: 03/23/2016 Time: 7:29 AM  Last Vitals:  Filed Vitals:   03/22/16 2300 03/23/16 0347  BP: 118/60 107/66  Pulse: 78 83  Temp: 37.2 C 37.1 C  Resp: 18 20    Level of Consciousness: alert  Pain: mild   Side Effects:None  Catheter Site Exam:clean  Plan: D/C from anesthesia care  Rica MastBachich,  Laterrian Hevener M

## 2016-03-23 NOTE — Anesthesia Postprocedure Evaluation (Signed)
Anesthesia Post Note  Patient: Sabrina Richards  Procedure(s) Performed: Procedure(s) (LRB): CESAREAN SECTION (N/A)  Patient location during evaluation: Mother Baby Anesthesia Type: Spinal Level of consciousness: awake and alert and oriented Pain management: pain level controlled Vital Signs Assessment: post-procedure vital signs reviewed and stable Respiratory status: spontaneous breathing Cardiovascular status: stable Postop Assessment: no headache Anesthetic complications: no    Last Vitals:  Filed Vitals:   03/22/16 2300 03/23/16 0347  BP: 118/60 107/66  Pulse: 78 83  Temp: 37.2 C 37.1 C  Resp: 18 20    Last Pain:  Filed Vitals:   03/23/16 0649  PainSc: 4                  Izen Petz,  Alessandra BevelsJennifer M

## 2016-03-23 NOTE — Progress Notes (Signed)
POD #1 Subjective:   Tolerating a regular diet. Foley removed this AM. Has not voided yet. Breast feeding baby.  Objective:  Blood pressure 103/53, pulse 80, temperature 98.3 F (36.8 C), temperature source Oral, resp. rate 20, height 5' (1.524 m), last menstrual period 06/07/2015, SpO2 95 %,  Urine output 2774m in last 24 hours.  General: NAD Pulmonary: no increased work of breathing/ CTA Abdomen: obese, non-distended, non-tender, BS active. Lochia: minimal Incision: Wound vac intact Extremities: no evidence of DVT  Results for orders placed or performed during the hospital encounter of 03/22/16 (from the past 72 hour(s))  Glucose, capillary     Status: None   Collection Time: 03/22/16  7:29 AM  Result Value Ref Range   Glucose-Capillary 76 65 - 99 mg/dL  Creatinine, serum     Status: None   Collection Time: 03/22/16 12:52 PM  Result Value Ref Range   Creatinine, Ser 0.48 0.44 - 1.00 mg/dL   GFR calc non Af Amer >60 >60 mL/min   GFR calc Af Amer >60 >60 mL/min    Comment: (NOTE) The eGFR has been calculated using the CKD EPI equation. This calculation has not been validated in all clinical situations. eGFR's persistently <60 mL/min signify possible Chronic Kidney Disease.   CBC     Status: Abnormal   Collection Time: 03/23/16  5:39 AM  Result Value Ref Range   WBC 9.5 3.6 - 11.0 K/uL   RBC 3.67 (L) 3.80 - 5.20 MIL/uL   Hemoglobin 12.2 12.0 - 16.0 g/dL   HCT 34.8 (L) 35.0 - 47.0 %   MCV 94.9 80.0 - 100.0 fL   MCH 33.3 26.0 - 34.0 pg   MCHC 35.1 32.0 - 36.0 g/dL   RDW 13.9 11.5 - 14.5 %   Platelets 120 (L) 150 - 440 K/uL   Baby O POS and mom O negative   Assessment:   89y.o. [id number] # 1-stable   Plan:  1.  Trial of voiding. Ambulate.  2) Rhogam indicated   3) TDAP UTD (3/22)  4) Breast/ contraception-minipill  5) Disposition-on POD #2 or 3. Has appt for wound vac removal at office next week. GDalia Heading CNM

## 2016-03-24 LAB — RHOGAM INJECTION: UNIT DIVISION: 0

## 2016-03-24 MED ORDER — IBUPROFEN 600 MG PO TABS: 600.0000 mg | ORAL_TABLET | Freq: Four times a day (QID) | ORAL | Status: DC

## 2016-03-24 MED ORDER — HYDROCODONE-ACETAMINOPHEN 5-325 MG PO TABS: 1.0000 | ORAL_TABLET | ORAL | Status: DC | PRN

## 2016-03-24 NOTE — Discharge Instructions (Signed)
Please call your doctor or return to the ER if you experience any chest pains, shortness of breath, fever greater than 101, any heavy bleeding or large clots, and foul smelling vaginal discharge, any worsening abdominal pain & cramping that is not controlled by pain medication, or any signs of post partum depression.  Check incision daily for redness or drainage.  No tampons, enemas, douches, or sexual intercourse for 6 weeks.  Also avoid tub baths, hot tubs, or swimming for 6 weeks. Cesarean Delivery, Care After Refer to this sheet in the next few weeks. These instructions provide you with information on caring for yourself after your procedure. Your health care provider may also give you specific instructions. Your treatment has been planned according to current medical practices, but problems sometimes occur. Call your health care provider if you have any problems or questions after you go home. HOME CARE INSTRUCTIONS  Only take over-the-counter or prescription medications as directed by your health care provider.  Do not drink alcohol, especially if you are breastfeeding or taking medication to relieve pain.  Do not chew or smoke tobacco.  Continue to use good perineal care. Good perineal care includes:  Wiping your perineum from front to back.  Keeping your perineum clean.  Check your surgical cut (incision) daily for increased redness, drainage, swelling, or separation of skin.  Clean your incision gently with soap and water every day, and then pat it dry. If your health care provider says it is okay, leave the incision uncovered. Use a bandage (dressing) if the incision is draining fluid or appears irritated. If the adhesive strips across the incision do not fall off within 7 days, carefully peel them off.  Hug a pillow when coughing or sneezing until your incision is healed. This helps to relieve pain.  Do not use tampons or douche until your health care provider says it is  okay.  Shower, wash your hair, and take tub baths as directed by your health care provider.  Wear a well-fitting bra that provides breast support.  Limit wearing support panties or control-top hose.  Drink enough fluids to keep your urine clear or pale yellow.  Eat high-fiber foods such as whole grain cereals and breads, brown rice, beans, and fresh fruits and vegetables every day. These foods may help prevent or relieve constipation.  Resume activities such as climbing stairs, driving, lifting, exercising, or traveling as directed by your health care provider.  Talk to your health care provider about resuming sexual activities. This is dependent upon your risk of infection, your rate of healing, and your comfort and desire to resume sexual activity.  Try to have someone help you with your household activities and your newborn for at least a few days after you leave the hospital.  Rest as much as possible. Try to rest or take a nap when your newborn is sleeping.  Increase your activities gradually.  Keep all of your scheduled postpartum appointments. It is very important to keep your scheduled follow-up appointments. At these appointments, your health care provider will be checking to make sure that you are healing physically and emotionally. SEEK MEDICAL CARE IF:   You are passing large clots from your vagina. Save any clots to show your health care provider.  You have a foul smelling discharge from your vagina.  You have trouble urinating.  You are urinating frequently.  You have pain when you urinate.  You have a change in your bowel movements.  You have increasing redness,  pain, or swelling near your incision.  You have pus draining from your incision.  Your incision is separating.  You have painful, hard, or reddened breasts.  You have a severe headache.  You have blurred vision or see spots.  You feel sad or depressed.  You have thoughts of hurting yourself or  your newborn.  You have questions about your care, the care of your newborn, or medications.  You are dizzy or light-headed.  You have a rash.  You have pain, redness, or swelling at the site of the removed intravenous access (IV) tube.  You have nausea or vomiting.  You stopped breastfeeding and have not had a menstrual period within 12 weeks of stopping.  You are not breastfeeding and have not had a menstrual period within 12 weeks of delivery.  You have a fever. SEEK IMMEDIATE MEDICAL CARE IF:  You have persistent pain.  You have chest pain.  You have shortness of breath.  You faint.  You have leg pain.  You have stomach pain.  Your vaginal bleeding saturates 2 or more sanitary pads in 1 hour. MAKE SURE YOU:   Understand these instructions.  Will watch your condition.  Will get help right away if you are not doing well or get worse.   This information is not intended to replace advice given to you by your health care provider. Make sure you discuss any questions you have with your health care provider.   Document Released: 07/28/2002 Document Revised: 11/26/2014 Document Reviewed: 07/02/2012 Elsevier Interactive Patient Education [employer]

## 2016-03-24 NOTE — Progress Notes (Signed)
D/C order from MD.  Reviewed d/c instructions and prescriptions with patient and answered any questions.  Patient d/c home with infant via wheelchair by nursing/auxillary. 

## 2016-04-02 ENCOUNTER — Other Ambulatory Visit: Payer: Self-pay

## 2016-11-24 IMAGING — US US MFM OB DETAIL+14 WK
2 series · 14 of 28 positions shown · non-contrast
Comparison: none

[Series 1: us mfm ob detail+14 wk · 0.33mm/px · 13 of 81 slices shown (1 of 2)]
[im 4/81]
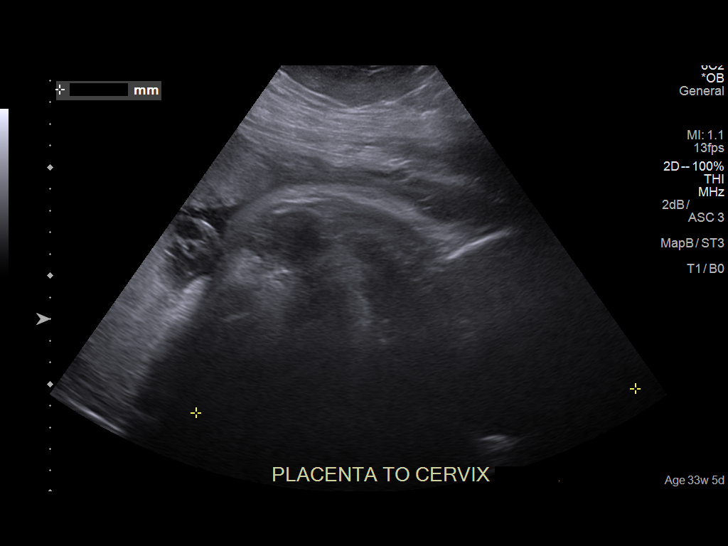
[im 10/81]
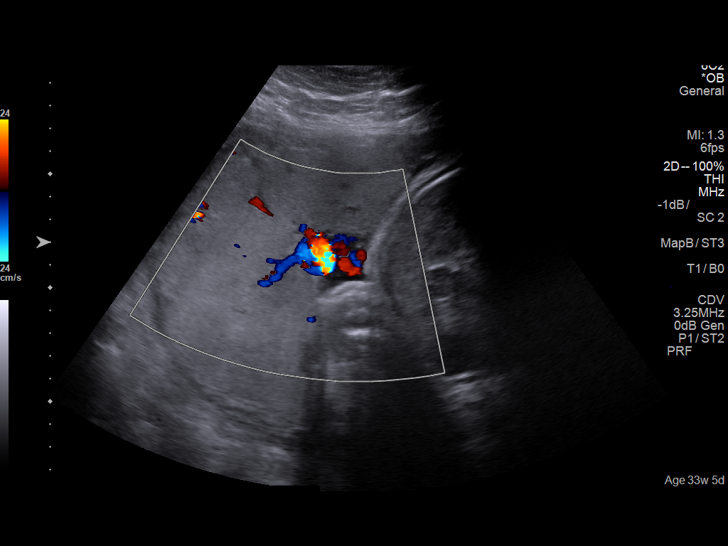
[im 16/81]
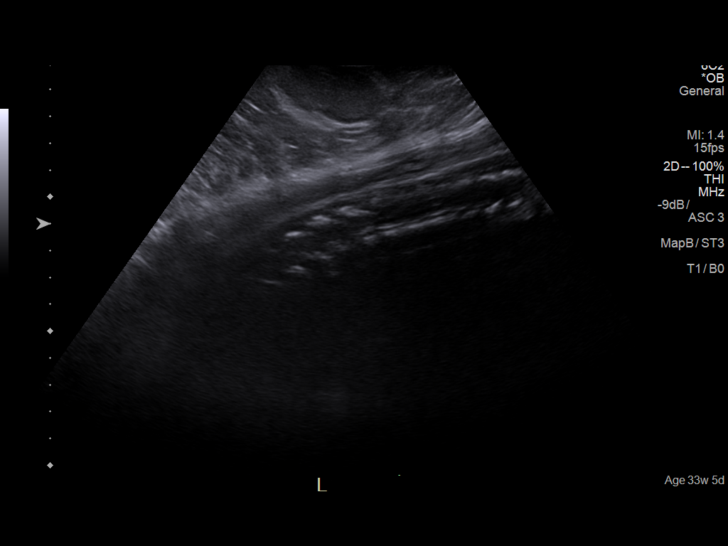
[im 22/81]
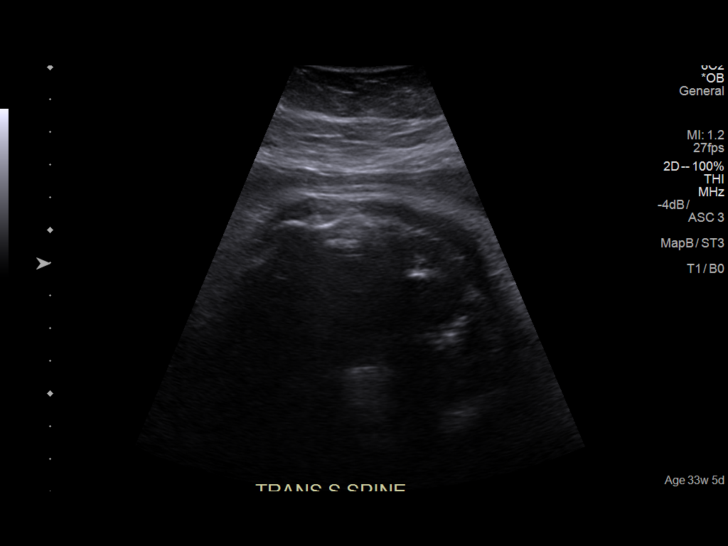
[im 28/81]
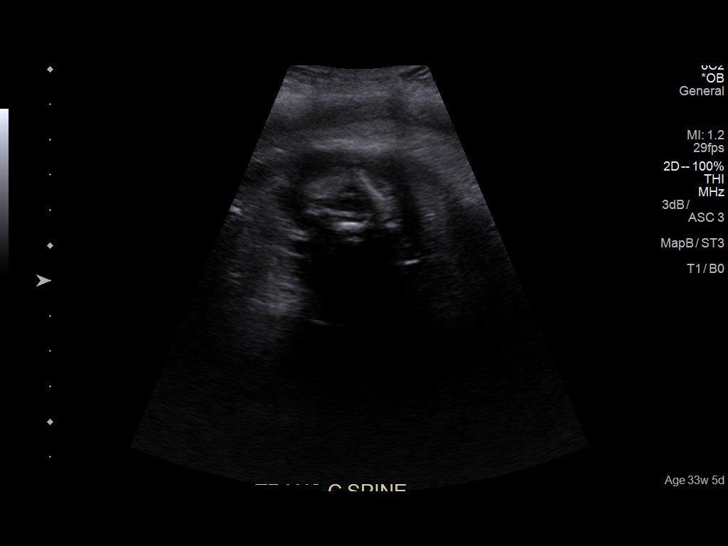
[im 34/81]
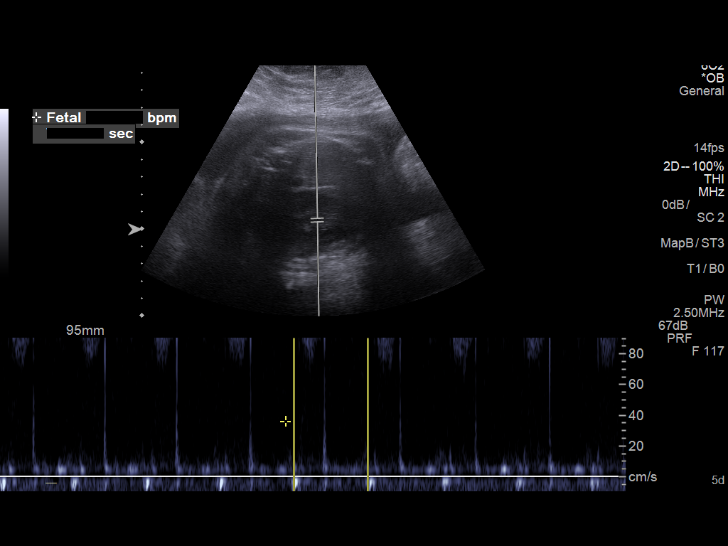
[im 41/81]
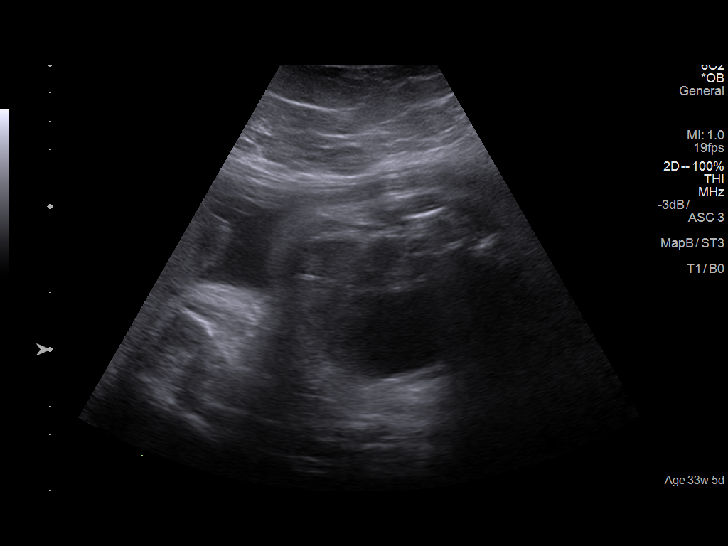
[im 47/81]
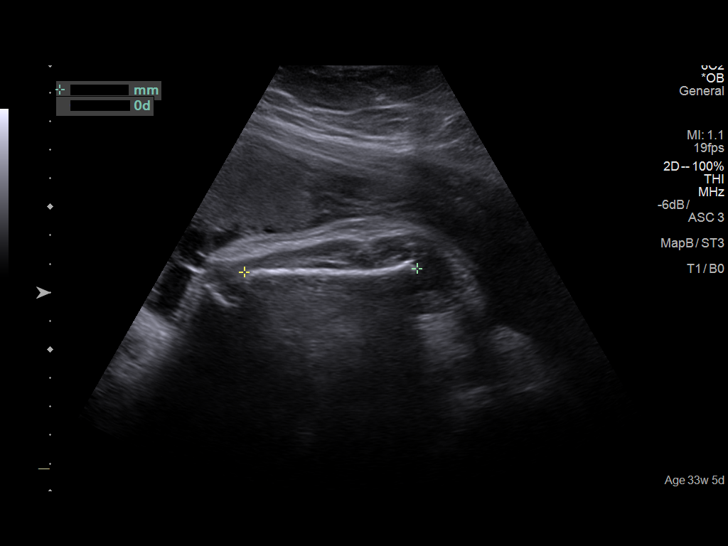
[im 53/81]
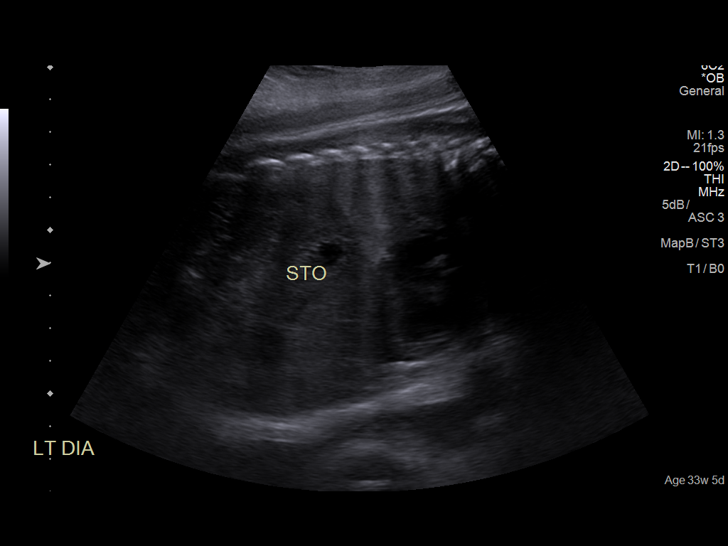
[im 59/81]
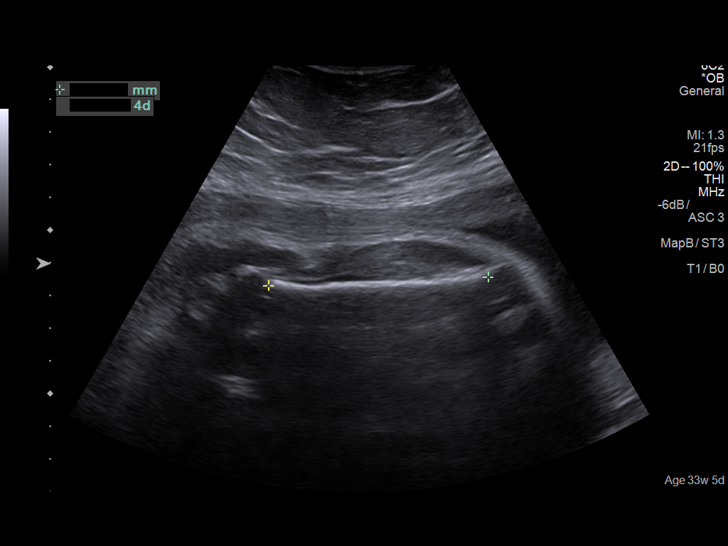
[im 65/81]
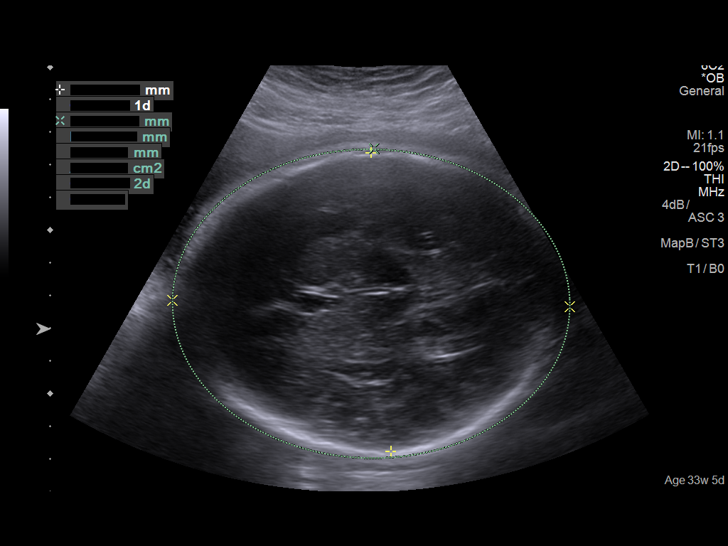
[im 71/81]
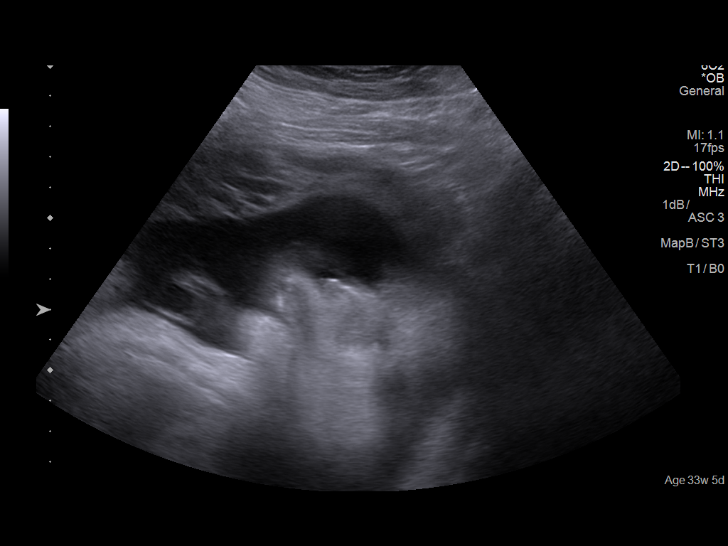
[im 77/81]
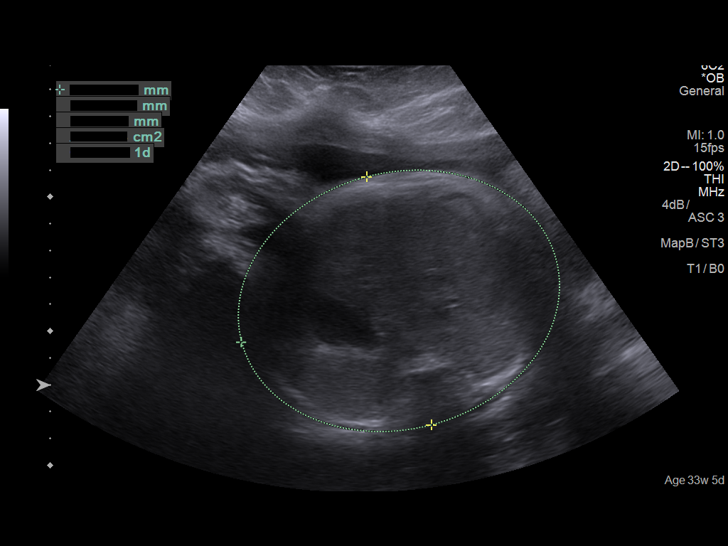

[Series 1001: us mfm ob detail+14 wk · 0.26mm/px · 1 of 2 slices shown (2 of 2)]
[im 1/2]
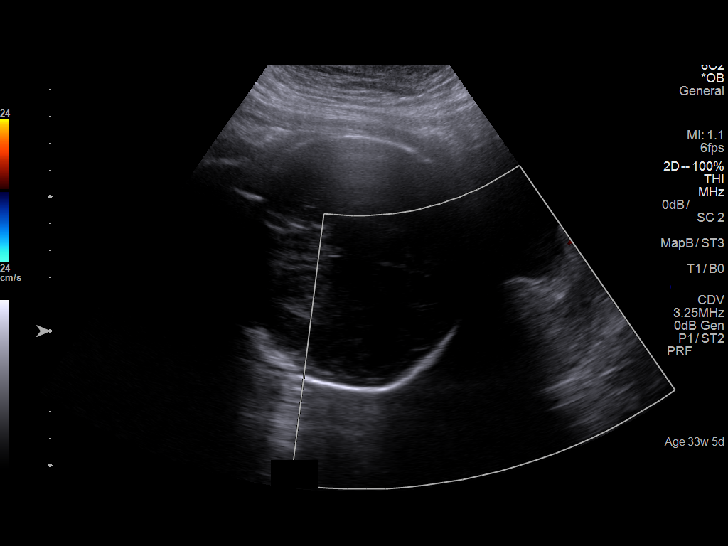

[14 of 28 positions shown; findings below may reference images not displayed]

Canned report from images found in remote index.

Refer to host system for actual result text.

## 2017-09-04 ENCOUNTER — Other Ambulatory Visit: Payer: Self-pay | Admitting: Otolaryngology

## 2017-09-04 DIAGNOSIS — K140 Glossitis: Secondary | ICD-10-CM

## 2017-09-11 ENCOUNTER — Ambulatory Visit
Admission: RE | Admit: 2017-09-11 | Discharge: 2017-09-11 | Disposition: A | Discharge status: Home / Self Care | Payer: 59 | Source: Ambulatory Visit | Attending: Otolaryngology | Admitting: Otolaryngology

## 2017-09-11 ENCOUNTER — Encounter: Payer: Self-pay | Admitting: Radiology

## 2017-09-11 DIAGNOSIS — K140 Glossitis: Secondary | ICD-10-CM | POA: Diagnosis present

## 2017-09-11 DIAGNOSIS — K149 Disease of tongue, unspecified: Secondary | ICD-10-CM | POA: Diagnosis not present

## 2017-09-11 MED ORDER — IOPAMIDOL (ISOVUE-300) INJECTION 61%: 75.0000 mL | Freq: Once | INTRAVENOUS | Status: DC | PRN

## 2017-11-21 ENCOUNTER — Encounter: Payer: Self-pay | Admitting: Gynecology

## 2017-11-21 ENCOUNTER — Other Ambulatory Visit: Payer: Self-pay

## 2017-11-21 ENCOUNTER — Ambulatory Visit
Admission: EM | Admit: 2017-11-21 | Discharge: 2017-11-21 | Disposition: A | Discharge status: Home / Self Care | Payer: BLUE CROSS/BLUE SHIELD | Attending: Family Medicine | Admitting: Family Medicine

## 2017-11-21 DIAGNOSIS — J069 Acute upper respiratory infection, unspecified: Secondary | ICD-10-CM | POA: Diagnosis not present

## 2017-11-21 MED ORDER — HYDROCOD POLST-CPM POLST ER 10-8 MG/5ML PO SUER: 5.0000 mL | Freq: Two times a day (BID) | ORAL | 0 refills | Status: DC

## 2017-11-21 MED ORDER — GUAIFENESIN-CODEINE 100-10 MG/5ML PO SYRP: 5.0000 mL | ORAL_SOLUTION | Freq: Three times a day (TID) | ORAL | 0 refills | Status: DC | PRN

## 2017-11-21 MED ORDER — BENZONATATE 200 MG PO CAPS: ORAL_CAPSULE | ORAL | 0 refills | Status: DC

## 2017-11-21 NOTE — ED Triage Notes (Signed)
Patient c/o sinus infection / cough x 1 week.

## 2017-11-21 NOTE — ED Provider Notes (Signed)
[facility]    CSN: [id number] Arrival date & time: 11/21/17  1814     History   Chief Complaint Chief Complaint  Patient presents with  . Appointment  . Facial Pain    HPI Sabrina Richards is a 37 y.o. female.   HPI  37 year old female who presents with a one-week history of sinus pain pressure and productive cough.  No fever or chills.  Children have been sick with the same type of symptoms initially and then were found to have ear infections on both of them.  Her husband has  also been sick.      Past Medical History:  Diagnosis Date  . Gestational diabetes   . Hypothyroidism   . Thyroid disease     Patient Active Problem List   Diagnosis Date Noted  . Labor and delivery, indication for care 03/24/2016  . History of cesarean delivery, currently pregnant 03/22/2016  . [redacted] weeks gestation of pregnancy 03/22/2016  . Gestational diabetes mellitus (GDM) controlled on oral hypoglycemic drug, antepartum 03/22/2016  . Obesity complicating pregnancy 03/22/2016  . Rh negative, antepartum 03/22/2016  . High-risk pregnancy in third trimester 03/22/2016  . Postpartum care following cesarean delivery 03/22/2016  . Hypothyroid in pregnancy, antepartum 03/22/2016    Past Surgical History:  Procedure Laterality Date  . ARM WOUND REPAIR / CLOSURE     Repair of dog bite  . CESAREAN SECTION    . CESAREAN SECTION N/A 03/22/2016   Procedure: CESAREAN SECTION;  Surgeon: Elenora Fender Ward, MD;  Location: [facility] ORS;  Service: Obstetrics;  Laterality: N/A;    OB History    Gravida Para Term Preterm AB Living   3 2 2   1 2    SAB TAB Ectopic Multiple Live Births         0 2       Home Medications    Prior to Admission medications   Medication Sig Start Date End Date Taking? Authorizing Provider  levothyroxine (SYNTHROID, LEVOTHROID) 50 MCG tablet Take 50 mcg by mouth daily before breakfast.   Yes [provider]  benzonatate (TESSALON) 200 MG capsule Take one  cap TID PRN cough 11/21/17   Lutricia Feil, PA-C  chlorpheniramine-HYDROcodone (TUSSIONEX PENNKINETIC ER) 10-8 MG/5ML SUER Take 5 mLs by mouth 2 (two) times daily. 11/21/17   Lutricia Feil, PA-C  ibuprofen (ADVIL,MOTRIN) 600 MG tablet Take 1 tablet (600 mg total) by mouth every 6 (six) hours. 03/24/16   Ward, Elenora Fender, MD    Family History Family History  Problem Relation Age of Onset  . Diabetes Father   . Hypertension Father   . Prostate cancer Father     Social History Social History   Tobacco Use  . Smoking status: Never Smoker  . Smokeless tobacco: Never Used  Substance Use Topics  . Alcohol use: No    Alcohol/week: 0.0 oz  . Drug use: No     Allergies   Oxycodone-acetaminophen   Review of Systems Review of Systems  Constitutional: Positive for activity change. Negative for appetite change, chills, fatigue and fever.  HENT: Positive for congestion, ear pain, postnasal drip, sinus pressure and sinus pain.   Respiratory: Positive for cough. Negative for shortness of breath, wheezing and stridor.   All other systems reviewed and are negative.    Physical Exam Triage Vital Signs ED Triage Vitals [11/21/17 1828]  Enc Vitals Group     BP (!) 133/58     Pulse Rate  76     Resp 16     Temp 98.5 F (36.9 C)     Temp Source Oral     SpO2 99 %     Weight 180 lb (81.6 kg)     Height 5' (1.524 m)     Head Circumference      Peak Flow      Pain Score 4     Pain Loc      Pain Edu?      Excl. in GC?    No data found.  Updated Vital Signs BP (!) 133/58 (BP Location: Left Arm)   Pulse 76   Temp 98.5 F (36.9 C) (Oral)   Resp 16   Ht 5' (1.524 m)   Wt 180 lb (81.6 kg)   LMP 10/31/2017   SpO2 99%   BMI 35.15 kg/m   Visual Acuity Right Eye Distance:   Left Eye Distance:   Bilateral Distance:    Right Eye Near:   Left Eye Near:    Bilateral Near:     Physical Exam  Constitutional: She is oriented to person, place, and time. She appears  well-developed and well-nourished. No distress.  HENT:  Head: Normocephalic.  Right Ear: External ear normal.  Left Ear: External ear normal.  Nose: Nose normal.  Mouth/Throat: Oropharynx is clear and moist. No oropharyngeal exudate.  Eyes: Pupils are equal, round, and reactive to light. Right eye exhibits no discharge. Left eye exhibits no discharge.  Neck: Normal range of motion.  Pulmonary/Chest: Effort normal and breath sounds normal.  Musculoskeletal: Normal range of motion.  Lymphadenopathy:    She has no cervical adenopathy.  Neurological: She is alert and oriented to person, place, and time.  Skin: Skin is warm and dry. She is not diaphoretic.  Psychiatric: She has a normal mood and affect. Her behavior is normal. Judgment and thought content normal.  Nursing note and vitals reviewed.    UC Treatments / Results  Labs (all labs ordered are listed, but only abnormal results are displayed) Labs Reviewed - No data to display  EKG  EKG Interpretation None       Radiology No results found.  Procedures Procedures (including critical care time)  Medications Ordered in UC Medications - No data to display   Initial Impression / Assessment and Plan / UC Course  I have reviewed the triage vital signs and the nursing notes.  Pertinent labs & imaging results that were available during my care of the patient were reviewed by me and considered in my medical decision making (see chart for details).     Plan: 1. Test/x-ray results and diagnosis reviewed with patient 2. rx as per orders; risks, benefits, potential side effects reviewed with patient 3. Recommend supportive treatment with fluids and rest.  Nettie pot followed by Flonase nasal spray for 3-4 weeks.  Is likely a virus and does not require antibiotic for treatment . If you continue to have symptoms or if they worsen in the next 10-14 days then return to our clinic or be seen by your primary care physician for  further evaluation. Use care with the Tussionex for activities requiring concentration or judgment and do not drive while taking the medication. 4. F/u prn if symptoms worsen or don't improve   Final Clinical Impressions(s) / UC Diagnoses   Final diagnoses:  Acute upper respiratory infection    ED Discharge Orders        Ordered    benzonatate (TESSALON)  200 MG capsule     11/21/17 1907    chlorpheniramine-HYDROcodone (TUSSIONEX PENNKINETIC ER) 10-8 MG/5ML SUER  2 times daily    Comments:  Please note that the patient would prefer sugar-free if possible   11/21/17 1907       Controlled Substance Prescriptions [place] Controlled Substance Registry consulted? Not Applicable   Lutricia Feil, PA-C 11/21/17 4098

## 2018-01-26 ENCOUNTER — Other Ambulatory Visit: Payer: Self-pay

## 2018-01-26 ENCOUNTER — Encounter: Payer: Self-pay | Admitting: Gynecology

## 2018-01-26 ENCOUNTER — Ambulatory Visit
Admission: EM | Admit: 2018-01-26 | Discharge: 2018-01-26 | Disposition: A | Discharge status: Home / Self Care | Payer: BLUE CROSS/BLUE SHIELD | Attending: Emergency Medicine | Admitting: Emergency Medicine

## 2018-01-26 DIAGNOSIS — J019 Acute sinusitis, unspecified: Secondary | ICD-10-CM

## 2018-01-26 DIAGNOSIS — J00 Acute nasopharyngitis [common cold]: Secondary | ICD-10-CM

## 2018-01-26 MED ORDER — FLUTICASONE PROPIONATE 50 MCG/ACT NA SUSP: 2.0000 | Freq: Every day | NASAL | 0 refills | Status: DC

## 2018-01-26 MED ORDER — IBUPROFEN 600 MG PO TABS: 600.0000 mg | ORAL_TABLET | Freq: Four times a day (QID) | ORAL | 1 refills | Status: DC | PRN

## 2018-01-26 MED ORDER — OSELTAMIVIR PHOSPHATE 75 MG PO CAPS: 75.0000 mg | ORAL_CAPSULE | Freq: Every day | ORAL | 0 refills | Status: AC

## 2018-01-26 NOTE — Discharge Instructions (Signed)
Take the medication as written. Start Mucinex-D to keep the mucous thin and to decongest you.  You may take 600 mg of motrin with 1 gram of tylenol up to 3-4 times a day as needed for pain. This is an effective combination for pain.  Most sinus infections are viral and do not need antibiotics unless you have a high fever, have had this for 10 days, or you get better and then get sick again. Use a NeilMed sinus rinse as often as you want to to reduce nasal congestion. Follow the directions on the box.   Go to www.goodrx.com to look up your medications. This will give you a list of where you can find your prescriptions at the most affordable prices. Or you can ask the pharmacist what the cash price is. This is frequently cheaper than going through insurance.   

## 2018-01-26 NOTE — ED Triage Notes (Signed)
Per patient sinus infection x yesterday.

## 2018-01-26 NOTE — ED Provider Notes (Signed)
HPI  SUBJECTIVE:  Sabrina Richards is a 37 y.o. female who presents with nasal congestion, sinus pressure, postnasal drip, sinus headaches, nonproductive cough, sore throat, chest tightness with exertion starting yesterday.  She tried Sudafed and a Nettie pot with improvement in her symptoms.  No aggravating factors.  She denies sinus pain, dental pain, wheezing, fevers, body aches, headaches, ear pain.  No abdominal pain, urinary complaints, diarrhea.  No allergy symptoms.  She states her child currently has flu symptoms.  No antibiotics in the past month or antipyretics in the past 6-8 hours.  Past medical history of gestational diabetes.  No history of asthma emphysema, COPD, smoking, allergies, sinusitis, hypertension.  LMP: Last Friday.  Denies the possibility of being pregnant.  NWG:[id number]:Santayana, Saintclair HalstedGloria Patricia, DO  Of note, patient's daughter has a confirmed case of influenza diagnosed today at this urgent care.  Mother is the primary caregiver for the patient.  Past Medical History:  Diagnosis Date  . Gestational diabetes   . Hypothyroidism   . Thyroid disease     Past Surgical History:  Procedure Laterality Date  . ARM WOUND REPAIR / CLOSURE     Repair of dog bite  . CESAREAN SECTION    . CESAREAN SECTION N/A 03/22/2016   Procedure: CESAREAN SECTION;  Surgeon: Elenora Fenderhelsea C Ward, MD;  Location: [facility] ORS;  Service: Obstetrics;  Laterality: N/A;    Family History  Problem Relation Age of Onset  . Diabetes Father   . Hypertension Father   . Prostate cancer Father     Social History   Tobacco Use  . Smoking status: Never Smoker  . Smokeless tobacco: Never Used  Substance Use Topics  . Alcohol use: No    Alcohol/week: 0.0 oz  . Drug use: No    No current facility-administered medications for this encounter.   Current Outpatient Medications:  .  levothyroxine (SYNTHROID, LEVOTHROID) 50 MCG tablet, Take 50 mcg by mouth daily before breakfast., Disp: , Rfl:  .   norethindrone-ethinyl estradiol-iron (ESTROSTEP FE,TILIA FE,TRI-LEGEST FE) 1-20/1-30/1-35 MG-MCG tablet, Take 1 tablet by mouth daily., Disp: , Rfl:  .  Potassium 75 MG TABS, Take by mouth., Disp: , Rfl:  .  fluticasone (FLONASE) 50 MCG/ACT nasal spray, Place 2 sprays into both nostrils daily., Disp: 16 g, Rfl: 0 .  ibuprofen (ADVIL,MOTRIN) 600 MG tablet, Take 1 tablet (600 mg total) by mouth every 6 (six) hours as needed., Disp: 90 tablet, Rfl: 1 .  oseltamivir (TAMIFLU) 75 MG capsule, Take 1 capsule (75 mg total) by mouth daily for 12 days., Disp: 12 capsule, Rfl: 0  Allergies  Allergen Reactions  . Oxycodone-Acetaminophen Nausea And Vomiting     ROS  As noted in HPI.   Physical Exam  BP 110/70 (BP Location: Left Arm)   Pulse 98   Temp 98.2 F (36.8 C) (Oral)   Resp 16   Ht 5' (1.524 m)   Wt 180 lb (81.6 kg)   LMP 01/17/2018   SpO2 98%   BMI 35.15 kg/m   Constitutional: Well developed, well nourished, no acute distress Eyes:  EOMI, conjunctiva normal bilaterally HENT: Normocephalic, atraumatic,mucus membranes moist. TMs normal bilaterally. +  Clear nasal congestion. Swollen red turbinates.  - maxillary sinus tenderness, - frontal sinus tenderness. tonsils normal + cobblestoning, postnasal drip.  Neck: No cervical lymphadenopathy Respiratory: Normal inspiratory effort, lungs clear bilaterally.  Good air movement. Cardiovascular: Normal rate, regular rhythm, no murmurs, rubs, gallops GI: nondistended skin: No rash, skin intact Musculoskeletal:  no deformities Neurologic: Alert & oriented x 3, no focal neuro deficits Psychiatric: Speech and behavior appropriate   ED Course   Medications - No data to display  No orders of the defined types were placed in this encounter.   No results found for this or any previous visit (from the past 24 hour(s)). No results found.  ED Clinical Impression  Acute nasopharyngitis  Acute non-recurrent sinusitis, unspecified  location   ED Assessment/Plan  Patient with URI/viral sinusitis.  No fevers >102, has had sx for < 10 days, no h/o double sickening. No historical or objective evidence of bacterial infection. No indication for abx. Will start Flonase , start Mucinex-d, increase fluids, nasal saline irrigation,  tylenol/motrin prn pain.  Also home with a prophylactic prescription of Tamiflu because she is the primary caregiver.  75 mg p.o. daily for 12 days.  Discussed MDM and plan with pt.  Pt agrees with plan and will f/u with PMD prn.   *This clinic note was created using Dragon dictation software. Therefore, there may be occasional mistakes despite careful proofreading.  ?    Domenick Gong, MD 01/27/18 1609

## 2019-07-10 IMAGING — CT CT NECK W/ CM
2 of 3 series · 8 of 14 positions shown, 10 images · IV contrast (isovue)
Comparison: None.

CLINICAL DATA: Multiple tongue infections intermittently for 2
years at the site of a tongue piercing

EXAM:
CT NECK WITH CONTRAST
TECHNIQUE: Multidetector CT imaging of the neck was performed using the
standard protocol following the bolus administration of intravenous
contrast.
CONTRAST:  75 cc Isovue 300 intravenous

[Series 2: axial neck · axial · 0.56mm/px · z∈[-215,-111]mm · 3 of 105 slices shown]
[im 27/105  bone]
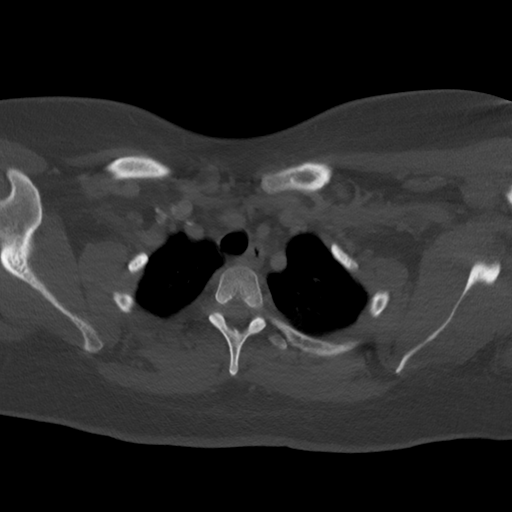
[im 53/105  bone]
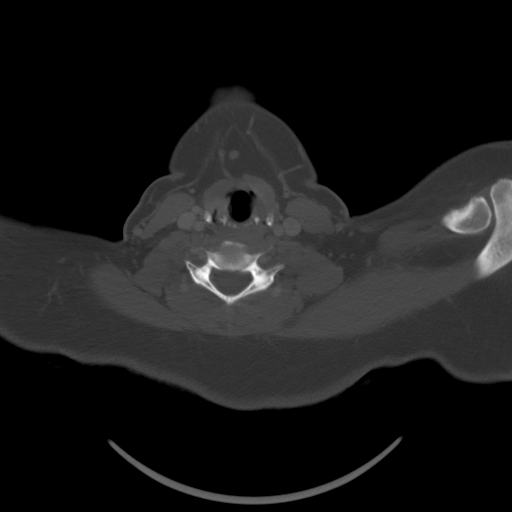
[im 79/105  bone]
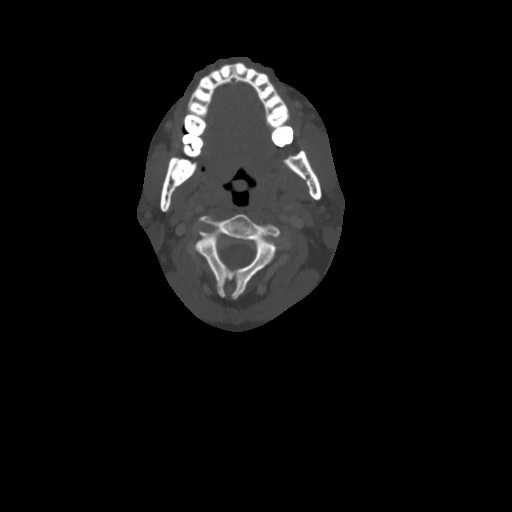

[Series 8: orthogonal ax · axial · 0.47mm/px · z∈[-275,-101]mm · 5 of 135 slices shown, 7 images]
[im 23/135  soft-tissue]
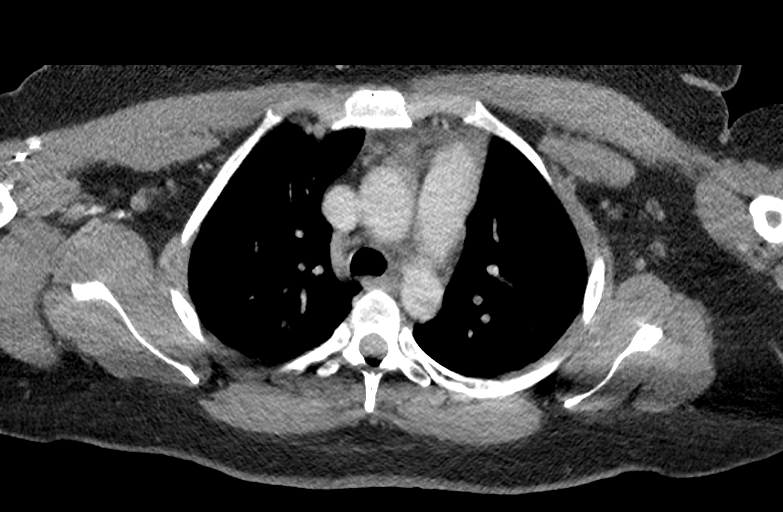
[im 23/135  bone]
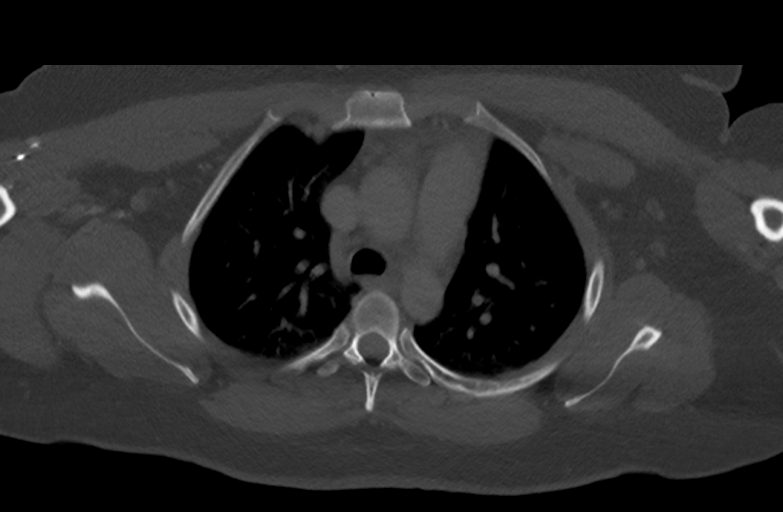
[im 45/135  bone]
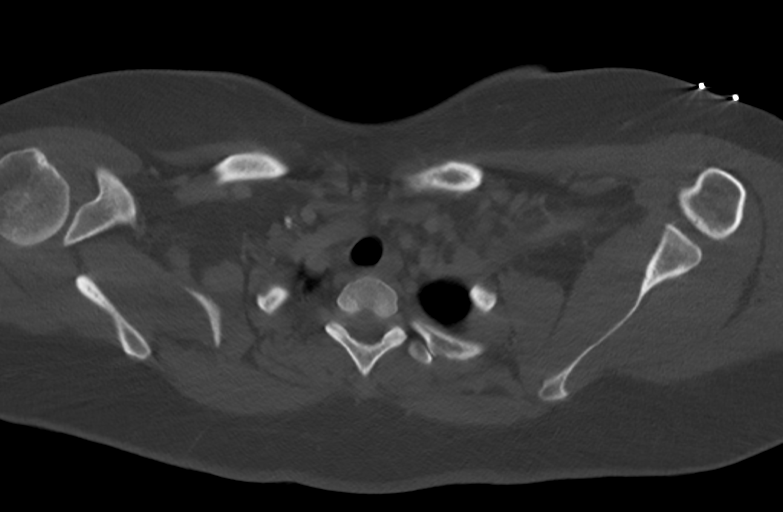
[im 68/135  bone]
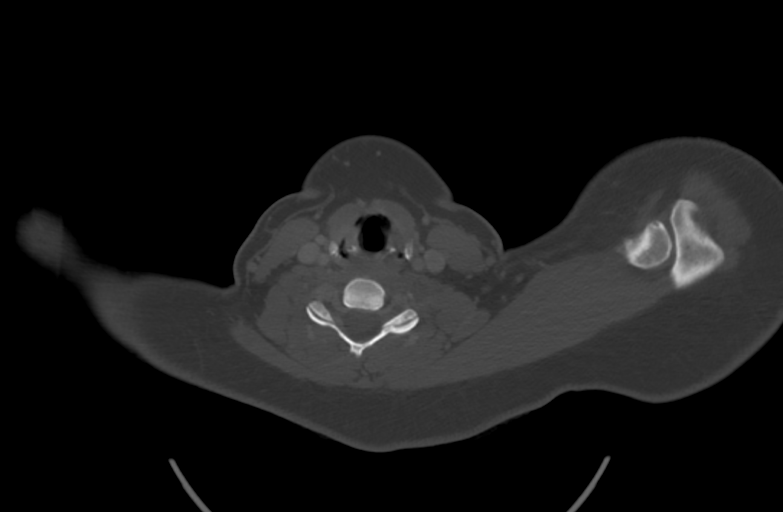
[im 90/135  bone]
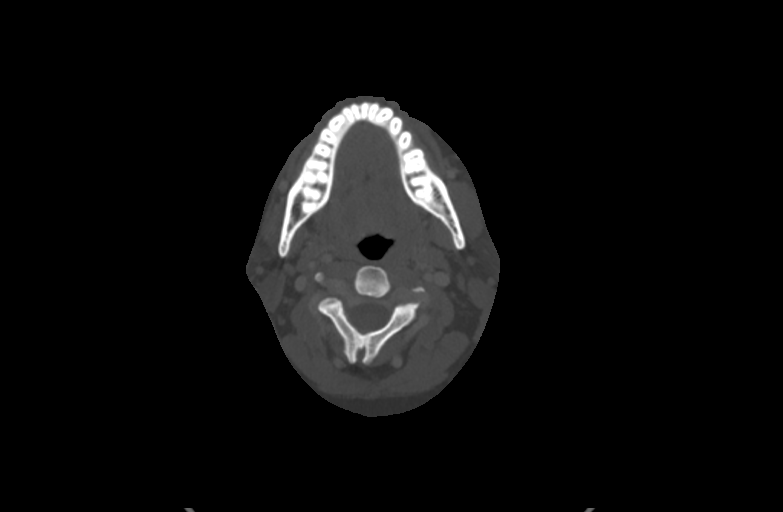
[im 112/135  soft-tissue]
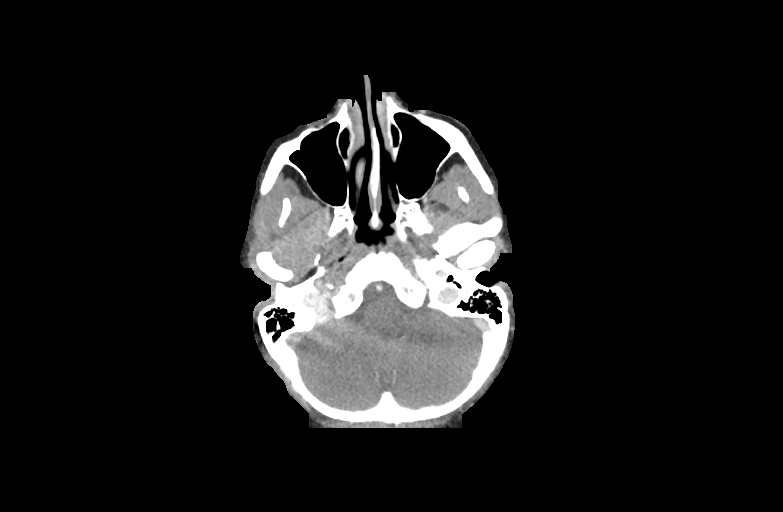
[im 112/135  bone]
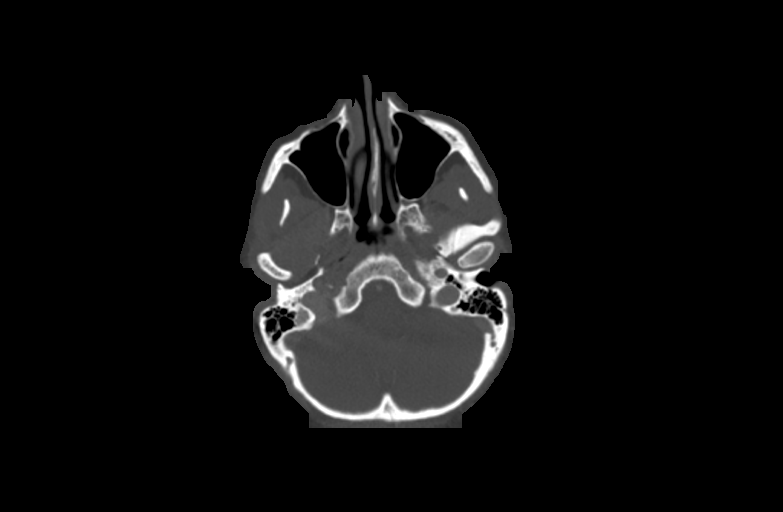

[8 of 14 positions shown; findings below may reference images not displayed]

FINDINGS: Pharynx and larynx: 3 mm density in the anterior tongue midline
which has a ring shape on sagittal reformats. This could reflect
calcification or opaque foreign body. This is presumably site of
prior infection and patient's tongue piercing.

Tonsilliths seen bilaterally. No evidence of active infection or
mass.

Salivary glands: Negative

Thyroid: Normal

Lymph nodes: None enlarged or abnormal density.

Vascular: Negative.

Limited intracranial: Negative.

Visualized orbits: Negative.

Mastoids and visualized paranasal sinuses: Clear.

Skeleton: No acute or aggressive process.

Upper chest: Negative.
IMPRESSION: 3 mm high-density along the inferior aspect of the anterior tongue,
presumably the site of prior tongue piercing. This could reflect
dystrophic calcification or tiny foreign body. No evidence of active
inflammation.

## 2019-11-06 ENCOUNTER — Other Ambulatory Visit: Payer: Self-pay | Admitting: Surgery

## 2019-11-11 ENCOUNTER — Encounter: Payer: Self-pay | Admitting: Surgery

## 2019-11-11 ENCOUNTER — Other Ambulatory Visit: Payer: Self-pay

## 2019-11-11 ENCOUNTER — Encounter
Admission: RE | Admit: 2019-11-11 | Discharge: 2019-11-11 | Disposition: A | Discharge status: Home / Self Care | Payer: Managed Care, Other (non HMO) | Source: Ambulatory Visit | Attending: Surgery | Admitting: Surgery

## 2019-11-11 DIAGNOSIS — Z01818 Encounter for other preprocedural examination: Secondary | ICD-10-CM | POA: Insufficient documentation

## 2019-11-11 NOTE — Patient Instructions (Signed)
Your procedure is scheduled on: 11/17/19 Report to [facility]. To find out your arrival time please call [phone] between 1PM - 3PM on 11/16/19.  Remember: Instructions that are not followed completely may result in serious medical risk, up to and including death, or upon the discretion of your surgeon and anesthesiologist your surgery may need to be rescheduled.     _X__ 1. Do not eat food after midnight the night before your procedure.                 No gum chewing or hard candies. You may drink clear liquids up to 2 hours                 before you are scheduled to arrive for your surgery- DO not drink clear                 liquids within 2 hours of the start of your surgery.                 Clear Liquids include:  water, apple juice without pulp, clear carbohydrate                 drink such as Clearfast or Gatorade, Black Coffee or Tea (Do not add                 anything to coffee or tea). Diabetics water only Ensure carb beverage should be finished 2 hours before arrival  __X__2.  On the morning of surgery brush your teeth with toothpaste and water, you                 may rinse your mouth with mouthwash if you wish.  Do not swallow any              toothpaste of mouthwash.     _X__ 3.  No Alcohol for 24 hours before or after surgery.   _X__ 4.  Do Not Smoke or use e-cigarettes For 24 Hours Prior to Your Surgery.                 Do not use any chewable tobacco products for at least 6 hours prior to                 surgery.  ____  5.  Bring all medications with you on the day of surgery if instructed.   __X__  6.  Notify your doctor if there is any change in your medical condition      (cold, fever, infections).     Do not wear jewelry, make-up, hairpins, clips or nail polish. Do not wear lotions, powders, or perfumes.  Do not shave 48 hours prior to surgery. Men may shave face and neck. Do not bring valuables to the  hospital.    [facility] is not responsible for any belongings or valuables.  Contacts, dentures/partials or body piercings may not be worn into surgery. Bring a case for your contacts, glasses or hearing aids, a denture cup will be supplied. Leave your suitcase in the car. After surgery it may be brought to your room. For patients admitted to the hospital, discharge time is determined by your treatment team.   Patients discharged the day of surgery will not be allowed to drive home.   Please read over the following fact sheets that you were given:   MRSA Information  __X__ Take these medicines the  morning of surgery with A SIP OF WATER:    1. levothyroxine (SYNTHROID, LEVOTHROID) 50 MCG tablet  2.   3.   4.  5.  6.  ____ Fleet Enema (as directed)   __X__ Use CHG Soap/SAGE wipes as directed  ____ Use inhalers on the day of surgery  ____ Stop metformin/Janumet/Farxiga 2 days prior to surgery    ____ Take 1/2 of usual insulin dose the night before surgery. No insulin the morning          of surgery.   ____ Stop Blood Thinners Coumadin/Plavix/Xarelto/Pleta/Pradaxa/Eliquis/Effient/Aspirin  on   Or contact your Surgeon, Cardiologist or Medical Doctor regarding  ability to stop your blood thinners  __X__ Stop Anti-inflammatories 7 days before surgery such as Advil, Ibuprofen, Motrin,  BC or Goodies Powder, Naprosyn, Naproxen, Aleve, Aspirin    __X__ Stop all herbal supplements, fish oil or vitamin E until after surgery.    ____ Bring C-Pap to the hospital.     Incentive Spirometer with instructions provided

## 2019-11-16 ENCOUNTER — Other Ambulatory Visit
Admission: RE | Admit: 2019-11-16 | Discharge: 2019-11-16 | Disposition: A | Discharge status: Home / Self Care | Payer: Managed Care, Other (non HMO) | Source: Ambulatory Visit | Attending: Surgery | Admitting: Surgery

## 2019-11-16 ENCOUNTER — Other Ambulatory Visit: Payer: Self-pay

## 2019-11-16 DIAGNOSIS — Z20828 Contact with and (suspected) exposure to other viral communicable diseases: Secondary | ICD-10-CM | POA: Diagnosis not present

## 2019-11-16 DIAGNOSIS — Z01812 Encounter for preprocedural laboratory examination: Secondary | ICD-10-CM | POA: Insufficient documentation

## 2019-11-16 LAB — SARS CORONAVIRUS 2 (TAT 6-24 HRS): SARS Coronavirus 2: NEGATIVE

## 2019-11-16 MED ORDER — CEFAZOLIN SODIUM-DEXTROSE 2-4 GM/100ML-% IV SOLN
2.0000 g | INTRAVENOUS | Status: AC
  Administered 2019-11-17: 14:00:00 2 g via INTRAVENOUS

## 2019-11-17 ENCOUNTER — Ambulatory Visit: Payer: Managed Care, Other (non HMO) | Admitting: Certified Registered Nurse Anesthetist

## 2019-11-17 ENCOUNTER — Ambulatory Visit
Admission: RE | Admit: 2019-11-17 | Discharge: 2019-11-17 | Disposition: A | Discharge status: Home / Self Care | Payer: Managed Care, Other (non HMO) | Attending: Surgery | Admitting: Surgery

## 2019-11-17 ENCOUNTER — Encounter: Payer: Self-pay | Admitting: Surgery

## 2019-11-17 ENCOUNTER — Encounter
Admission: RE | Disposition: A | Discharge status: Home / Self Care | Payer: Self-pay | Source: Home / Self Care | Attending: Surgery

## 2019-11-17 DIAGNOSIS — Z885 Allergy status to narcotic agent status: Secondary | ICD-10-CM | POA: Insufficient documentation

## 2019-11-17 DIAGNOSIS — M67432 Ganglion, left wrist: Secondary | ICD-10-CM | POA: Diagnosis present

## 2019-11-17 DIAGNOSIS — E039 Hypothyroidism, unspecified: Secondary | ICD-10-CM | POA: Diagnosis not present

## 2019-11-17 DIAGNOSIS — Z79899 Other long term (current) drug therapy: Secondary | ICD-10-CM | POA: Insufficient documentation

## 2019-11-17 DIAGNOSIS — Z79890 Hormone replacement therapy (postmenopausal): Secondary | ICD-10-CM | POA: Diagnosis not present

## 2019-11-17 HISTORY — DX: Other specified postprocedural states: R11.2

## 2019-11-17 HISTORY — PX: ORIF SCAPHOID FRACTURE: SHX2130

## 2019-11-17 HISTORY — DX: Other specified postprocedural states: Z98.890

## 2019-11-17 LAB — POCT PREGNANCY, URINE: Preg Test, Ur: NEGATIVE

## 2019-11-17 SURGERY — OPEN REDUCTION INTERNAL FIXATION (ORIF) SCAPHOID FRACTURE
Anesthesia: General | Site: Shoulder | Laterality: Left

## 2019-11-17 MED ORDER — PROPOFOL 10 MG/ML IV BOLUS
INTRAVENOUS | Status: DC | PRN
  Administered 2019-11-17: 20 mg via INTRAVENOUS
  Administered 2019-11-17: 180 mg via INTRAVENOUS

## 2019-11-17 MED ORDER — HYDROCODONE-ACETAMINOPHEN 5-325 MG PO TABS
ORAL_TABLET | ORAL | Status: AC
  Administered 2019-11-17: 1
  Filled 2019-11-17: qty 1

## 2019-11-17 MED ORDER — LIDOCAINE HCL (PF) 2 % IJ SOLN
INTRAMUSCULAR | Status: AC
  Filled 2019-11-17: qty 10

## 2019-11-17 MED ORDER — FAMOTIDINE 20 MG PO TABS: 20.0000 mg | ORAL_TABLET | Freq: Once | ORAL | Status: AC

## 2019-11-17 MED ORDER — LIDOCAINE HCL (CARDIAC) PF 100 MG/5ML IV SOSY
PREFILLED_SYRINGE | INTRAVENOUS | Status: DC | PRN
  Administered 2019-11-17: 90 mg via INTRAVENOUS

## 2019-11-17 MED ORDER — DEXAMETHASONE SODIUM PHOSPHATE 10 MG/ML IJ SOLN
INTRAMUSCULAR | Status: DC | PRN
  Administered 2019-11-17: 10 mg via INTRAVENOUS

## 2019-11-17 MED ORDER — HYDROCODONE-ACETAMINOPHEN 5-325 MG PO TABS: 1.0000 | ORAL_TABLET | Freq: Four times a day (QID) | ORAL | 0 refills | Status: DC | PRN

## 2019-11-17 MED ORDER — SEVOFLURANE IN SOLN
RESPIRATORY_TRACT | Status: AC
  Filled 2019-11-17: qty 250

## 2019-11-17 MED ORDER — DEXAMETHASONE SODIUM PHOSPHATE 10 MG/ML IJ SOLN
INTRAMUSCULAR | Status: AC
  Filled 2019-11-17: qty 1

## 2019-11-17 MED ORDER — FENTANYL CITRATE (PF) 100 MCG/2ML IJ SOLN
INTRAMUSCULAR | Status: AC
  Administered 2019-11-17: 25 ug via INTRAVENOUS
  Filled 2019-11-17: qty 2

## 2019-11-17 MED ORDER — PROPOFOL 500 MG/50ML IV EMUL
INTRAVENOUS | Status: DC | PRN
  Administered 2019-11-17: 125 ug/kg/min via INTRAVENOUS

## 2019-11-17 MED ORDER — ACETAMINOPHEN 10 MG/ML IV SOLN
INTRAVENOUS | Status: AC
  Filled 2019-11-17: qty 100

## 2019-11-17 MED ORDER — MIDAZOLAM HCL 2 MG/2ML IJ SOLN
INTRAMUSCULAR | Status: DC | PRN
  Administered 2019-11-17: 2 mg via INTRAVENOUS

## 2019-11-17 MED ORDER — PROMETHAZINE HCL 25 MG/ML IJ SOLN
INTRAMUSCULAR | Status: AC
  Filled 2019-11-17: qty 1

## 2019-11-17 MED ORDER — FAMOTIDINE 20 MG PO TABS
ORAL_TABLET | ORAL | Status: AC
  Administered 2019-11-17: 12:00:00 20 mg via ORAL
  Filled 2019-11-17: qty 1

## 2019-11-17 MED ORDER — PROPOFOL 10 MG/ML IV BOLUS
INTRAVENOUS | Status: AC
  Filled 2019-11-17: qty 20

## 2019-11-17 MED ORDER — BUPIVACAINE HCL (PF) 0.5 % IJ SOLN
INTRAMUSCULAR | Status: AC
  Filled 2019-11-17: qty 30

## 2019-11-17 MED ORDER — PROPOFOL 500 MG/50ML IV EMUL
INTRAVENOUS | Status: AC
  Filled 2019-11-17: qty 50

## 2019-11-17 MED ORDER — BUPIVACAINE HCL (PF) 0.5 % IJ SOLN
INTRAMUSCULAR | Status: DC | PRN
  Administered 2019-11-17: 5 mL

## 2019-11-17 MED ORDER — ONDANSETRON HCL 4 MG/2ML IJ SOLN
INTRAMUSCULAR | Status: AC
  Filled 2019-11-17: qty 2

## 2019-11-17 MED ORDER — ONDANSETRON HCL 4 MG/2ML IJ SOLN
INTRAMUSCULAR | Status: DC | PRN
  Administered 2019-11-17: 4 mg via INTRAVENOUS

## 2019-11-17 MED ORDER — ROCURONIUM BROMIDE 50 MG/5ML IV SOLN
INTRAVENOUS | Status: AC
  Filled 2019-11-17: qty 1

## 2019-11-17 MED ORDER — FENTANYL CITRATE (PF) 100 MCG/2ML IJ SOLN
INTRAMUSCULAR | Status: DC | PRN
  Administered 2019-11-17 (×10): 25 ug via INTRAVENOUS

## 2019-11-17 MED ORDER — LACTATED RINGERS IV SOLN: INTRAVENOUS | Status: DC

## 2019-11-17 MED ORDER — MIDAZOLAM HCL 2 MG/2ML IJ SOLN
INTRAMUSCULAR | Status: AC
  Filled 2019-11-17: qty 2

## 2019-11-17 MED ORDER — CHLORHEXIDINE GLUCONATE 4 % EX LIQD
60.0000 mL | Freq: Once | CUTANEOUS | Status: AC
  Administered 2019-11-17: 4 via TOPICAL

## 2019-11-17 MED ORDER — PROMETHAZINE HCL 25 MG/ML IJ SOLN
6.2500 mg | INTRAMUSCULAR | Status: DC | PRN
  Administered 2019-11-17: 6.25 mg via INTRAVENOUS

## 2019-11-17 MED ORDER — MEPERIDINE HCL 50 MG/ML IJ SOLN: 6.2500 mg | INTRAMUSCULAR | Status: DC | PRN

## 2019-11-17 MED ORDER — FENTANYL CITRATE (PF) 250 MCG/5ML IJ SOLN
INTRAMUSCULAR | Status: AC
  Filled 2019-11-17: qty 5

## 2019-11-17 MED ORDER — FENTANYL CITRATE (PF) 100 MCG/2ML IJ SOLN
25.0000 ug | INTRAMUSCULAR | Status: AC | PRN
  Administered 2019-11-17 (×4): 25 ug via INTRAVENOUS

## 2019-11-17 MED ORDER — HYDROCODONE-ACETAMINOPHEN 5-325 MG PO TABS: 1.0000 | ORAL_TABLET | ORAL | Status: DC | PRN

## 2019-11-17 MED ORDER — ACETAMINOPHEN 10 MG/ML IV SOLN
INTRAVENOUS | Status: DC | PRN
  Administered 2019-11-17: 1000 mg via INTRAVENOUS

## 2019-11-17 MED ORDER — SCOPOLAMINE 1 MG/3DAYS TD PT72: 1.0000 | MEDICATED_PATCH | Freq: Once | TRANSDERMAL | Status: DC

## 2019-11-17 MED ORDER — CEFAZOLIN SODIUM-DEXTROSE 2-4 GM/100ML-% IV SOLN
INTRAVENOUS | Status: AC
  Filled 2019-11-17: qty 100

## 2019-11-17 MED ORDER — SODIUM CHLORIDE FLUSH 0.9 % IV SOLN
INTRAVENOUS | Status: AC
  Filled 2019-11-17: qty 10

## 2019-11-17 MED ORDER — SCOPOLAMINE 1 MG/3DAYS TD PT72
MEDICATED_PATCH | TRANSDERMAL | Status: AC
  Administered 2019-11-17: 12:00:00 1.5 mg via TRANSDERMAL
  Filled 2019-11-17: qty 1

## 2019-11-17 SURGICAL SUPPLY — 44 items
BNDG COHESIVE 4X5 TAN STRL (GAUZE/BANDAGES/DRESSINGS) ×3 IMPLANT
BNDG ESMARK 4X12 TAN STRL LF (GAUZE/BANDAGES/DRESSINGS) ×3 IMPLANT
BUR RND POLISHING (BURR) ×3 IMPLANT
CANISTER SUCT 1200ML W/VALVE (MISCELLANEOUS) ×3 IMPLANT
CHLORAPREP W/TINT 26 (MISCELLANEOUS) ×6 IMPLANT
CORD BIP STRL DISP 12FT (MISCELLANEOUS) ×3 IMPLANT
COVER WAND RF STERILE (DRAPES) ×3 IMPLANT
CUFF TOURN SGL QUICK 24 (TOURNIQUET CUFF) ×2
CUFF TRNQT CYL 24X4X16.5-23 (TOURNIQUET CUFF) IMPLANT
DRAPE FLUOR MINI C-ARM 54X84 (DRAPES) ×3 IMPLANT
DRAPE SPLIT 6X30 W/TAPE (DRAPES) ×3 IMPLANT
DRAPE SURG 17X11 SM STRL (DRAPES) ×3 IMPLANT
DRAPE U-SHAPE 47X51 STRL (DRAPES) ×3 IMPLANT
ELECT CAUTERY BLADE 6.4 (BLADE) ×3 IMPLANT
ELECT REM PT RETURN 9FT ADLT (ELECTROSURGICAL) ×3
ELECTRODE REM PT RTRN 9FT ADLT (ELECTROSURGICAL) ×1 IMPLANT
FORCEPS JEWEL BIP 4-3/4 STR (INSTRUMENTS) ×3 IMPLANT
GAUZE SPONGE 4X4 12PLY STRL (GAUZE/BANDAGES/DRESSINGS) ×3 IMPLANT
GAUZE XEROFORM 1X8 LF (GAUZE/BANDAGES/DRESSINGS) ×3 IMPLANT
GLOVE BIO SURGEON STRL SZ8 (GLOVE) ×6 IMPLANT
GLOVE INDICATOR 8.0 STRL GRN (GLOVE) ×3 IMPLANT
GOWN STRL REUS W/ TWL LRG LVL3 (GOWN DISPOSABLE) ×1 IMPLANT
GOWN STRL REUS W/ TWL XL LVL3 (GOWN DISPOSABLE) ×1 IMPLANT
GOWN STRL REUS W/TWL LRG LVL3 (GOWN DISPOSABLE) ×2
GOWN STRL REUS W/TWL XL LVL3 (GOWN DISPOSABLE) ×2
KIT TURNOVER KIT A (KITS) ×3 IMPLANT
NDL FILTER BLUNT 18X1 1/2 (NEEDLE) ×1 IMPLANT
NEEDLE FILTER BLUNT 18X 1/2SAF (NEEDLE) ×2
NEEDLE FILTER BLUNT 18X1 1/2 (NEEDLE) ×1 IMPLANT
NS IRRIG 500ML POUR BTL (IV SOLUTION) ×3 IMPLANT
PACK EXTREMITY ARMC (MISCELLANEOUS) ×3 IMPLANT
PAD CAST CTTN 4X4 STRL (SOFTGOODS) ×2 IMPLANT
PADDING CAST COTTON 4X4 STRL (SOFTGOODS) ×4
PLASTER CAST 3X15YD (MISCELLANEOUS) ×2
PUTTY DBX 1CC (Putty) ×3 IMPLANT
PUTTY DBX 1CC DEPUY (Putty) IMPLANT
SPLINT PLASTER CAST FAST 3X15 (MISCELLANEOUS) IMPLANT
STAPLER SKIN PROX 35W (STAPLE) ×3 IMPLANT
STOCKINETTE IMPERVIOUS 9X36 MD (GAUZE/BANDAGES/DRESSINGS) ×3 IMPLANT
SUT ETHIBOND 2-0 (SUTURE) ×2 IMPLANT
SUT PROLENE 4 0 PS 2 18 (SUTURE) ×6 IMPLANT
SUT VIC AB 2-0 SH 27 (SUTURE) ×4
SUT VIC AB 2-0 SH 27XBRD (SUTURE) ×2 IMPLANT
SYR 10ML LL (SYRINGE) ×3 IMPLANT

## 2019-11-17 NOTE — Anesthesia Postprocedure Evaluation (Signed)
Anesthesia Post Note  Patient: AUNA MIKKELSEN  Procedure(s) Performed: EXCISION / DEBRIDEMENT WITH BONE GRAFTING OF AN INTRAOSSEOUS CYST OF LEFT SCAPHOID (Left Shoulder)  Patient location during evaluation: PACU Anesthesia Type: General Level of consciousness: awake and alert and oriented Pain management: pain level controlled Vital Signs Assessment: post-procedure vital signs reviewed and stable Respiratory status: spontaneous breathing, nonlabored ventilation and respiratory function stable Cardiovascular status: blood pressure returned to baseline and stable Postop Assessment: no signs of nausea or vomiting Anesthetic complications: no     Last Vitals:  Vitals:   11/17/19 1520 11/17/19 1525  BP:    Pulse: 81 65  Resp: 15 15  Temp:    SpO2: 94% 94%    Last Pain:  Vitals:   11/17/19 1525  TempSrc:   PainSc: 8                  Romello Hoehn

## 2019-11-17 NOTE — Anesthesia Procedure Notes (Signed)
Procedure Name: LMA Insertion Date/Time: 11/17/2019 1:35 PM Performed by: Eben Burow, CRNA Pre-anesthesia Checklist: Patient identified, Emergency Drugs available, Suction available and Patient being monitored Patient Re-evaluated:Patient Re-evaluated prior to induction Oxygen Delivery Method: Circle system utilized Preoxygenation: Pre-oxygenation with 100% oxygen Induction Type: IV induction Ventilation: Mask ventilation without difficulty LMA: LMA inserted LMA Size: 4.0 Number of attempts: 1 Placement Confirmation: positive ETCO2 and breath sounds checked- equal and bilateral Tube secured with: Tape Dental Injury: Teeth and Oropharynx as per pre-operative assessment

## 2019-11-17 NOTE — Discharge Instructions (Addendum)
Orthopedic discharge instructions: Keep splint dry and intact. Keep hand elevated above heart level. Apply ice to affected area frequently. Take ibuprofen 600-800 mg TID with meals for 7-10 days, then as necessary.   (TID = three times daily) Take pain medication as prescribed or ES Tylenol when needed.  Return for follow-up in 10-14 days or as scheduled.    AMBULATORY SURGERY  DISCHARGE INSTRUCTIONS   1) The drugs that you were given will stay in your system until tomorrow so for the next 24 hours you should not:  A) Drive an automobile B) Make any legal decisions C) Drink any alcoholic beverage   2) You may resume regular meals tomorrow.  Today it is better to start with liquids and gradually work up to solid foods.  You may eat anything you prefer, but it is better to start with liquids, then soup and crackers, and gradually work up to solid foods.   3) Please notify your doctor immediately if you have any unusual bleeding, trouble breathing, redness and pain at the surgery site, drainage, fever, or pain not relieved by medication.    4) Additional Instructions:        Please contact your physician with any problems or Same Day Surgery at [phone], Monday through Friday 6 am to 4 pm, or [place] at [facility] number at [phone].

## 2019-11-17 NOTE — H&P (Signed)
Paper H&P to be scanned into permanent record. H&P reviewed and patient re-examined. No changes. 

## 2019-11-17 NOTE — Op Note (Signed)
11/17/2019  3:17 PM  Patient:   Sabrina Richards  Pre-Op Diagnosis:   Intra-osseous ganglion cyst, left scaphoid.  Post-Op Diagnosis:   Same  Procedure:   Debridement and bone grafting of interosseous ganglion cyst, left scaphoid.  Surgeon:   Pascal Lux, MD  Assistant:   Cherlynn June, PA-S  Anesthesia:   General LMA  Findings:   As above.  Complications:   None  Fluids:   700 cc crystalloid  EBL:   1 cc  UOP:   None  TT:   58 minutes at 250 mmHg  Drains:   None  Closure:   4-0 Prolene interrupted sutures  Brief Clinical Note:   The patient is a 38 year old female with a several month history of progressively worsening left radial sided wrist pain.  Her symptoms have progressed despite medications, activity modification, splinting, etc.  Her history examination are suspicious for a carpal ganglion.  However, an MRI scan demonstrated that there was an intra-osseous ganglion of the scaphoid with extra-osseous extension.  The patient presents at this time for excision of the extra-articular portion of the ganglion cyst and debridement/grafting of the interosseous portion of the cyst.  Procedure:   The patient was brought into the operating room and lain in the supine position.  After adequate general laryngeal mask anesthesia was obtained, the patient's left hand and upper extremity were prepped with ChloraPrep solution before being draped sterilely.  Preoperative antibiotics were administered.  After performing a timeout to verify the appropriate surgical site, the limb was exsanguinated with an Esmarch and the tourniquet inflated to 250 mmHg.    An approximate 4 cm incision was made longitudinally over the distal portion of the flexor carpi radialis tendon centered over the distal pole of the scaphoid.  The incision was carried down through the subcutaneous tissues to expose the flexor carpi radialis tendon.  The sheath was released volarly before the tendon was retracted  ulnarly.  The floor of the flexor carpi radialis tendon sheath was then opened to access the volar wrist region.  A longitudinal incision was made through the volar ligaments to access the radial scaphoid joint.  A small ganglion cyst was encountered as verified by a small amount of clear gelatinous material.  A limited synovectomy was performed, removing the cyst in the process.    The site of the intra-osseous extension of the cyst was identified utilizing fluoroscopic imaging and a small window created in this area to access the cyst.  The cyst was curetted out with a small curettes and its margins perforated several times with a 0.045 K wire.  The cavity and radial scaphoid joint was irrigated thoroughly with sterile saline solution before the cyst was filled with a small amount of DBX putty.  The capsule was closed using 2-0 Ethibond interrupted sutures before the superficial FCR tendon sheath was closed using 2-0 Vicryl interrupted sutures.  The skin was closed using 4-0 Prolene interrupted sutures.  A total of 5 cc of 0.5% plain Sensorcaine was injected in and around the incision site and with postoperative analgesia.  A sterile bulky dressing was applied to the wound before the patient was placed into a thumb spica splint maintaining the thumb in neutral position.  The patient was then awakened, extubated, and returned to the recovery room in satisfactory condition after tolerating the procedure well.

## 2019-11-17 NOTE — Anesthesia Preprocedure Evaluation (Signed)
Anesthesia Evaluation  Patient identified by MRN, date of birth, ID band Patient awake    Reviewed: Allergy & Precautions, NPO status , Patient's Chart, lab work & pertinent test results  History of Anesthesia Complications (+) PONV and history of anesthetic complications  Airway Mallampati: II  TM Distance: >3 FB Neck ROM: Full    Dental no notable dental hx.    Pulmonary neg pulmonary ROS, neg sleep apnea, neg COPD,    breath sounds clear to auscultation- rhonchi (-) wheezing      Cardiovascular Exercise Tolerance: Good (-) hypertension(-) CAD, (-) Past MI, (-) Cardiac Stents and (-) CABG  Rhythm:Regular Rate:Normal - Systolic murmurs and - Diastolic murmurs    Neuro/Psych neg Seizures negative neurological ROS  negative psych ROS   GI/Hepatic negative GI ROS, Neg liver ROS,   Endo/Other  diabetesHypothyroidism   Renal/GU negative Renal ROS     Musculoskeletal negative musculoskeletal ROS (+)   Abdominal (+) + obese,   Peds  Hematology negative hematology ROS (+)   Anesthesia Other Findings Past Medical History: No date: Gestational diabetes No date: Hypothyroidism No date: PONV (postoperative nausea and vomiting) No date: Thyroid disease   Reproductive/Obstetrics negative OB ROS                             Anesthesia Physical Anesthesia Plan  ASA: II  Anesthesia Plan: General   Post-op Pain Management:    Induction: Intravenous  PONV Risk Score and Plan: 3 and Propofol infusion, TIVA, Ondansetron, Dexamethasone and Scopolamine patch - Pre-op  Airway Management Planned: LMA  Additional Equipment:   Intra-op Plan:   Post-operative Plan:   Informed Consent: I have reviewed the patients History and Physical, chart, labs and discussed the procedure including the risks, benefits and alternatives for the proposed anesthesia with the patient or authorized representative who  has indicated his/her understanding and acceptance.     Dental advisory given  Plan Discussed with: CRNA and Anesthesiologist  Anesthesia Plan Comments:         Anesthesia Quick Evaluation

## 2019-11-17 NOTE — Anesthesia Post-op Follow-up Note (Signed)
Anesthesia QCDR form completed.        

## 2019-11-17 NOTE — Transfer of Care (Signed)
Immediate Anesthesia Transfer of Care Note  Patient: Sabrina Richards  Procedure(s) Performed: EXCISION / DEBRIDEMENT WITH BONE GRAFTING OF AN INTRAOSSEOUS CYST OF LEFT SCAPHOID (Left Shoulder)  Patient Location: PACU  Anesthesia Type:General  Level of Consciousness: drowsy  Airway & Oxygen Therapy: Patient Spontanous Breathing and Patient connected to face mask oxygen  Post-op Assessment: Report given to RN and Post -op Vital signs reviewed and stable  Post vital signs: Reviewed and stable  Last Vitals:  Vitals Value Taken Time  BP 110/57 11/17/19 1505  Temp 36 C 11/17/19 1505  Pulse 63 11/17/19 1508  Resp 20 11/17/19 1508  SpO2 99 % 11/17/19 1508  Vitals shown include unvalidated device data.  Last Pain:  Vitals:   11/17/19 1138  TempSrc: Temporal  PainSc: 2          Complications: No apparent anesthesia complications

## 2020-11-07 ENCOUNTER — Ambulatory Visit: Payer: Managed Care, Other (non HMO) | Attending: Internal Medicine

## 2020-11-07 DIAGNOSIS — Z23 Encounter for immunization: Secondary | ICD-10-CM

## 2020-11-07 NOTE — Progress Notes (Signed)
   Covid-19 Vaccination Clinic  Name:  NYARI OLSSON    MRN: [id number] DOB: 24-Dec-1980  11/07/2020  Ms. Huxtable was observed post Covid-19 immunization for 15 minutes without incident. She was provided with Vaccine Information Sheet and instruction to access the V-Safe system.   Ms. Arrick was instructed to call 911 with any severe reactions post vaccine: Marland Kitchen Difficulty breathing  . Swelling of face and throat  . A fast heartbeat  . A bad rash all over body  . Dizziness and weakness   Immunizations Administered    Name Date Dose VIS Date Route   Pfizer COVID-19 Vaccine 11/07/2020  2:13 PM 0.3 mL 09/07/2020 Intramuscular   Manufacturer: [employer], [employer]   Lot: [id number]   NDC: [address]-3

## 2020-11-28 ENCOUNTER — Ambulatory Visit: Payer: Managed Care, Other (non HMO) | Attending: Internal Medicine

## 2020-11-28 DIAGNOSIS — Z23 Encounter for immunization: Secondary | ICD-10-CM

## 2020-11-28 NOTE — Progress Notes (Signed)
   Covid-19 Vaccination Clinic  Name:  Sabrina Richards    MRN: [id number] DOB: 1981/11/06  11/28/2020  [name] was observed post Covid-19 immunization for 15 minutes without incident. She was provided with Vaccine Information Sheet and instruction to access the V-Safe system.   [name] was instructed to call 911 with any severe reactions post vaccine: Marland Kitchen Difficulty breathing  . Swelling of face and throat  . A fast heartbeat  . A bad rash all over body  . Dizziness and weakness   Immunizations Administered    Name Date Dose VIS Date Route   Pfizer COVID-19 Vaccine 11/28/2020  1:55 PM 0.3 mL 09/07/2020 Intramuscular   Manufacturer: [employer], [employer]   Lot: [id number]   NDC: [address]-1

## 2021-01-14 ENCOUNTER — Encounter: Payer: Self-pay | Admitting: Gynecology

## 2021-01-14 ENCOUNTER — Other Ambulatory Visit: Payer: Self-pay

## 2021-01-14 ENCOUNTER — Ambulatory Visit
Admission: EM | Admit: 2021-01-14 | Discharge: 2021-01-14 | Disposition: A | Discharge status: Home / Self Care | Payer: Managed Care, Other (non HMO) | Attending: Emergency Medicine | Admitting: Emergency Medicine

## 2021-01-14 DIAGNOSIS — R42 Dizziness and giddiness: Secondary | ICD-10-CM | POA: Insufficient documentation

## 2021-01-14 LAB — CBC WITH DIFFERENTIAL/PLATELET
Abs Immature Granulocytes: 0.02 10*3/uL (ref 0.00–0.07)
Basophils Absolute: 0 10*3/uL (ref 0.0–0.1)
Basophils Relative: 1 %
Eosinophils Absolute: 0.1 10*3/uL (ref 0.0–0.5)
Eosinophils Relative: 2 %
HCT: 39.6 % (ref 36.0–46.0)
Hemoglobin: 13 g/dL (ref 12.0–15.0)
Immature Granulocytes: 0 %
Lymphocytes Relative: 30 %
Lymphs Abs: 1.7 10*3/uL (ref 0.7–4.0)
MCH: 29.3 pg (ref 26.0–34.0)
MCHC: 32.8 g/dL (ref 30.0–36.0)
MCV: 89.4 fL (ref 80.0–100.0)
Monocytes Absolute: 0.4 10*3/uL (ref 0.1–1.0)
Monocytes Relative: 7 %
Neutro Abs: 3.4 10*3/uL (ref 1.7–7.7)
Neutrophils Relative %: 60 %
Platelets: 249 10*3/uL (ref 150–400)
RBC: 4.43 MIL/uL (ref 3.87–5.11)
RDW: 14.3 % (ref 11.5–15.5)
WBC: 5.7 10*3/uL (ref 4.0–10.5)
nRBC: 0 % (ref 0.0–0.2)

## 2021-01-14 LAB — URINALYSIS, COMPLETE (UACMP) WITH MICROSCOPIC
Bilirubin Urine: NEGATIVE
Glucose, UA: NEGATIVE mg/dL
Hgb urine dipstick: NEGATIVE
Ketones, ur: NEGATIVE mg/dL
Nitrite: NEGATIVE
Protein, ur: NEGATIVE mg/dL
Specific Gravity, Urine: 1.02 (ref 1.005–1.030)
pH: 7 (ref 5.0–8.0)

## 2021-01-14 LAB — COMPREHENSIVE METABOLIC PANEL
ALT: 15 U/L (ref 0–44)
AST: 16 U/L (ref 15–41)
Albumin: 3.3 g/dL — ABNORMAL LOW (ref 3.5–5.0)
Alkaline Phosphatase: 46 U/L (ref 38–126)
Anion gap: 11 (ref 5–15)
BUN: 17 mg/dL (ref 6–20)
CO2: 23 mmol/L (ref 22–32)
Calcium: 9 mg/dL (ref 8.9–10.3)
Chloride: 102 mmol/L (ref 98–111)
Creatinine, Ser: 0.75 mg/dL (ref 0.44–1.00)
GFR, Estimated: >60 mL/min (ref >60)
Glucose, Bld: 104 mg/dL — ABNORMAL HIGH (ref 70–99)
Potassium: 4.2 mmol/L (ref 3.5–5.1)
Sodium: 136 mmol/L (ref 135–145)
Total Bilirubin: 0.4 mg/dL (ref 0.3–1.2)
Total Protein: 6.7 g/dL (ref 6.5–8.1)

## 2021-01-14 LAB — PREGNANCY, URINE: Preg Test, Ur: NEGATIVE

## 2021-01-14 MED ORDER — ONDANSETRON 8 MG PO TBDP
8.0000 mg | ORAL_TABLET | Freq: Once | ORAL | Status: AC
  Administered 2021-01-14: 8 mg via ORAL

## 2021-01-14 MED ORDER — MECLIZINE HCL 12.5 MG PO TABS: 12.5000 mg | ORAL_TABLET | Freq: Three times a day (TID) | ORAL | 0 refills | Status: DC | PRN

## 2021-01-14 NOTE — Discharge Instructions (Addendum)
Your work up  thus far is normal, which is reassuring at this time. Your ekg and labs look well. No indication of UTI or dehydration.  Your thyroid level is still in process, we would call you if this is abnormal. I would like you to try taking meclizine to see if it is helpful at all with your symptoms. May cause some drowsiness.  Drink plenty of water.  Limit or avoid caffeine as it can cause dehydration and some palpitations.  If symptoms are persistent please follow up with your PCP as you may need further evaluation.  If worsening please go to the ER.

## 2021-01-14 NOTE — ED Provider Notes (Signed)
[facility]    CSN: [id number] Arrival date & time: 01/14/21  0818      History   Chief Complaint Chief Complaint  Patient presents with  . Dizziness    HPI Sabrina Richards is a 40 y.o. female.   Sabrina Richards presents with complaints of dizziness sensation which started yesterday. Started while at work. Worse with activity. This morning feels worse. Head movement, even speaking, seems to worsen the sensation. No recent falls or head injury. She checked her blood sugar this morning, history of GDM, and it was 118, also did a home covid test which was negative. No recent travel. No URI symptoms. Nausea, no vomiting. No headache. No vision changes. No diarrhea. LMP 2/1 and was normal for her. She is on birth control. No urinary symptoms. She takes synthroid. No chest pain  Or palpitations. Denies any previous similar. No ear pain. Hasn't taken any medications for symptoms. States she feels sensitivity to loud noise.    ROS per HPI, negative if not otherwise mentioned.      Past Medical History:  Diagnosis Date  . Gestational diabetes   . Hypothyroidism   . PONV (postoperative nausea and vomiting)   . Thyroid disease     Patient Active Problem List   Diagnosis Date Noted  . Labor and delivery, indication for care 03/24/2016  . History of cesarean delivery, currently pregnant 03/22/2016  . [redacted] weeks gestation of pregnancy 03/22/2016  . Gestational diabetes mellitus (GDM) controlled on oral hypoglycemic drug, antepartum 03/22/2016  . Obesity complicating pregnancy 03/22/2016  . Rh negative, antepartum 03/22/2016  . High-risk pregnancy in third trimester 03/22/2016  . Postpartum care following cesarean delivery 03/22/2016  . Hypothyroid in pregnancy, antepartum 03/22/2016    Past Surgical History:  Procedure Laterality Date  . ARM WOUND REPAIR / CLOSURE     Repair of dog bite  . CESAREAN SECTION    . CESAREAN SECTION N/A 03/22/2016   Procedure: CESAREAN  SECTION;  Surgeon: Elenora Fender Ward, MD;  Location: [facility] ORS;  Service: Obstetrics;  Laterality: N/A;  . ORIF SCAPHOID FRACTURE Left 11/17/2019   Procedure: EXCISION / DEBRIDEMENT WITH BONE GRAFTING OF AN INTRAOSSEOUS CYST OF LEFT SCAPHOID;  Surgeon: Christena Flake, MD;  Location: [facility] ORS;  Service: Orthopedics;  Laterality: Left;    OB History    Gravida  3   Para  2   Term  2   Preterm      AB  1   Living  2     SAB      IAB      Ectopic      Multiple  0   Live Births  2            Home Medications    Prior to Admission medications   Medication Sig Start Date End Date Taking? Authorizing Provider  levothyroxine (SYNTHROID, LEVOTHROID) 50 MCG tablet Take 50 mcg by mouth daily before breakfast.   Yes [provider]  meclizine (ANTIVERT) 12.5 MG tablet Take 1-2 tablets (12.5-25 mg total) by mouth 3 (three) times daily as needed for dizziness. 01/14/21  Yes Georgetta Haber, NP  SPRINTEC 28 0.25-35 MG-MCG tablet Take 1 tablet by mouth daily. 09/12/19  Yes [provider]  fluticasone (FLONASE) 50 MCG/ACT nasal spray Place 2 sprays into both nostrils daily. Patient not taking: Reported on 11/09/2019 01/26/18   Domenick Gong, MD  HYDROcodone-acetaminophen [facility]) 5-325 MG tablet Take 1-2 tablets by  mouth every 6 (six) hours as needed for moderate pain. MAXIMUM TOTAL ACETAMINOPHEN DOSE IS 4000 MG PER DAY 11/17/19   Poggi, Excell Seltzer, MD  ibuprofen (ADVIL,MOTRIN) 600 MG tablet Take 1 tablet (600 mg total) by mouth every 6 (six) hours as needed. Patient not taking: No sig reported 01/26/18   Domenick Gong, MD    Family History Family History  Problem Relation Age of Onset  . Diabetes Father   . Hypertension Father   . Prostate cancer Father     Social History Social History   Tobacco Use  . Smoking status: Never Smoker  . Smokeless tobacco: Never Used  Vaping Use  . Vaping Use: Never used  Substance Use Topics  . Alcohol use: No     Alcohol/week: 0.0 standard drinks  . Drug use: No     Allergies   Oxycodone-acetaminophen   Review of Systems Review of Systems   Physical Exam Triage Vital Signs ED Triage Vitals  Enc Vitals Group     BP 01/14/21 0829 119/62     Pulse Rate 01/14/21 0829 76     Resp 01/14/21 0829 16     Temp 01/14/21 0829 98.5 F (36.9 C)     Temp Source 01/14/21 0829 Oral     SpO2 01/14/21 0829 98 %     Weight 01/14/21 0831 230 lb (104.3 kg)     Height 01/14/21 0831 5' (1.524 m)     Head Circumference --      Peak Flow --      Pain Score 01/14/21 0831 0     Pain Loc --      Pain Edu? --      Excl. in GC? --    Orthostatic VS for the past 24 hrs:  BP- Lying Pulse- Lying BP- Sitting Pulse- Sitting BP- Standing at 0 minutes Pulse- Standing at 0 minutes  01/14/21 0915 124/74 77 127/76 76 113/80 81    Updated Vital Signs BP 119/62 (BP Location: Left Arm)   Pulse 76   Temp 98.5 F (36.9 C) (Oral)   Resp 16   Ht 5' (1.524 m)   Wt 230 lb (104.3 kg)   LMP 12/20/2020   SpO2 98%   BMI 44.92 kg/m    Physical Exam Constitutional:      General: She is not in acute distress.    Appearance: She is well-developed.  HENT:     Head: Normocephalic and atraumatic.     Right Ear: Tympanic membrane normal.     Left Ear: Tympanic membrane normal.     Nose: Nose normal.     Mouth/Throat:     Mouth: Mucous membranes are moist.  Eyes:     Extraocular Movements: Extraocular movements intact.     Conjunctiva/sclera: Conjunctivae normal.     Pupils: Pupils are equal, round, and reactive to light.  Cardiovascular:     Rate and Rhythm: Normal rate.  Pulmonary:     Effort: Pulmonary effort is normal.     Breath sounds: Normal breath sounds.  Skin:    General: Skin is warm and dry.  Neurological:     General: No focal deficit present.     Mental Status: She is alert and oriented to person, place, and time.     Cranial Nerves: No cranial nerve deficit.     Sensory: No sensory deficit.      Motor: No weakness.     Coordination: Coordination normal.     Comments: Ambulatory  without difficulty or abnormal gait noted   Psychiatric:        Mood and Affect: Mood normal.        Behavior: Behavior normal.    EKG:  NSR rate of 72. Previous EKG was not available for review. No stwave changes as interpreted by me.    UC Treatments / Results  Labs (all labs ordered are listed, but only abnormal results are displayed) Labs Reviewed  COMPREHENSIVE METABOLIC PANEL - Abnormal; Notable for the following components:      Result Value   Glucose, Bld 104 (*)    Albumin 3.3 (*)    All other components within normal limits  URINALYSIS, COMPLETE (UACMP) WITH MICROSCOPIC - Abnormal; Notable for the following components:   APPearance CLOUDY (*)    Leukocytes,Ua MODERATE (*)    Bacteria, UA FEW (*)    All other components within normal limits  URINE CULTURE  CBC WITH DIFFERENTIAL/PLATELET  PREGNANCY, URINE  TSH    EKG   Radiology No results found.  Procedures Procedures (including critical care time)  Medications Ordered in UC Medications  ondansetron (ZOFRAN-ODT) disintegrating tablet 8 mg (8 mg Oral Given 01/14/21 0931)    Initial Impression / Assessment and Plan / UC Course  I have reviewed the triage vital signs and the nursing notes.  Pertinent labs & imaging results that were available during my care of the patient were reviewed by me and considered in my medical decision making (see chart for details).     Acute onset of dizziness. No head injury. Vitals stable. No neurological findings. Ambulatory. ekg without acute findings. No obvious orthostatic vitals. Urine with leukocytes and some bacteria, likely contaminated but cultured to confirm. No URI symptoms or recent travel. Labs are unremarkable. Vertigo considered at this time with meclizine provided. zofran in clinic did resolve nausea. Return precautions provided. Patient verbalized understanding and agreeable to  plan.  Ambulatory out of clinic without difficulty.    Final Clinical Impressions(s) / UC Diagnoses   Final diagnoses:  Dizziness     Discharge Instructions     Your work up  thus far is normal, which is reassuring at this time. Your ekg and labs look well. No indication of UTI or dehydration.  Your thyroid level is still in process, we would call you if this is abnormal. I would like you to try taking meclizine to see if it is helpful at all with your symptoms. May cause some drowsiness.  Drink plenty of water.  Limit or avoid caffeine as it can cause dehydration and some palpitations.  If symptoms are persistent please follow up with your PCP as you may need further evaluation.  If worsening please go to the ER.      ED Prescriptions    Medication Sig Dispense Auth. Provider   meclizine (ANTIVERT) 12.5 MG tablet Take 1-2 tablets (12.5-25 mg total) by mouth 3 (three) times daily as needed for dizziness. 30 tablet Georgetta Haber, NP     PDMP not reviewed this encounter.   Georgetta Haber, NP 01/14/21 1019

## 2021-01-14 NOTE — ED Triage Notes (Signed)
Patient c/o dizziness x yesterday. Per patient little bit nausea/ sensitivity to loud noise.

## 2021-01-15 LAB — URINE CULTURE: Culture: <10000 — AB

## 2021-01-15 LAB — TSH: TSH: 1.653 u[IU]/mL (ref 0.350–4.500)

## 2021-09-08 ENCOUNTER — Other Ambulatory Visit: Payer: Self-pay

## 2021-09-08 ENCOUNTER — Ambulatory Visit
Admission: EM | Admit: 2021-09-08 | Discharge: 2021-09-08 | Disposition: A | Discharge status: Home / Self Care | Payer: Managed Care, Other (non HMO) | Attending: Emergency Medicine | Admitting: Emergency Medicine

## 2021-09-08 ENCOUNTER — Encounter: Payer: Self-pay | Admitting: Emergency Medicine

## 2021-09-08 DIAGNOSIS — J069 Acute upper respiratory infection, unspecified: Secondary | ICD-10-CM | POA: Diagnosis not present

## 2021-09-08 DIAGNOSIS — Z20822 Contact with and (suspected) exposure to covid-19: Secondary | ICD-10-CM | POA: Diagnosis not present

## 2021-09-08 LAB — RESP PANEL BY RT-PCR (FLU A&B, COVID) ARPGX2
Influenza A by PCR: NEGATIVE
Influenza B by PCR: NEGATIVE
SARS Coronavirus 2 by RT PCR: NEGATIVE

## 2021-09-08 NOTE — ED Triage Notes (Signed)
Patient c/o cough, sinus pressure, runny nose, post nasal drip and bodyaches that started early this morning.  Patient denies fevers.

## 2021-09-08 NOTE — ED Provider Notes (Signed)
[facility]  ____________________________________________  Time seen: Approximately 1:54 PM  I have reviewed the triage vital signs and the nursing notes.   HISTORY  Chief Complaint Cough, Sinus Problem, and Generalized Body Aches   Historian Patient     HPI Sabrina Richards is a 40 y.o. female presents to the urgent care with cold-like symptoms for 1 day.  Patient reports that her children have had similar symptoms this week and would like COVID and flu testing.  No chest pain, chest tightness or abdominal pain.   Past Medical History:  Diagnosis Date   Gestational diabetes    Hypothyroidism    PONV (postoperative nausea and vomiting)    Thyroid disease      Immunizations up to date:  Yes.     Past Medical History:  Diagnosis Date   Gestational diabetes    Hypothyroidism    PONV (postoperative nausea and vomiting)    Thyroid disease     Patient Active Problem List   Diagnosis Date Noted   Labor and delivery, indication for care 03/24/2016   History of cesarean delivery, currently pregnant 03/22/2016   [redacted] weeks gestation of pregnancy 03/22/2016   Gestational diabetes mellitus (GDM) controlled on oral hypoglycemic drug, antepartum 03/22/2016   Obesity complicating pregnancy 03/22/2016   Rh negative, antepartum 03/22/2016   High-risk pregnancy in third trimester 03/22/2016   Postpartum care following cesarean delivery 03/22/2016   Hypothyroid in pregnancy, antepartum 03/22/2016    Past Surgical History:  Procedure Laterality Date   ARM WOUND REPAIR / CLOSURE     Repair of dog bite   CESAREAN SECTION     CESAREAN SECTION N/A 03/22/2016   Procedure: CESAREAN SECTION;  Surgeon: Elenora Fender Ward, MD;  Location: [facility] ORS;  Service: Obstetrics;  Laterality: N/A;   ORIF SCAPHOID FRACTURE Left 11/17/2019   Procedure: EXCISION / DEBRIDEMENT WITH BONE GRAFTING OF AN INTRAOSSEOUS CYST OF LEFT SCAPHOID;  Surgeon: Christena Flake, MD;  Location: [facility] ORS;   Service: Orthopedics;  Laterality: Left;    Prior to Admission medications   Medication Sig Start Date End Date Taking? Authorizing Provider  busPIRone (BUSPAR) 10 MG tablet Take 10 mg by mouth 2 (two) times daily. 08/04/21  Yes [provider]  citalopram (CELEXA) 20 MG tablet Take 20 mg by mouth daily. 08/04/21  Yes [provider]  levothyroxine (SYNTHROID, LEVOTHROID) 50 MCG tablet Take 50 mcg by mouth daily before breakfast.   Yes [provider]  SPRINTEC 28 0.25-35 MG-MCG tablet Take 1 tablet by mouth daily. 09/12/19  Yes [provider]  fluticasone (FLONASE) 50 MCG/ACT nasal spray Place 2 sprays into both nostrils daily. Patient not taking: Reported on 11/09/2019 01/26/18   Domenick Gong, MD  HYDROcodone-acetaminophen [facility]) 5-325 MG tablet Take 1-2 tablets by mouth every 6 (six) hours as needed for moderate pain. MAXIMUM TOTAL ACETAMINOPHEN DOSE IS 4000 MG PER DAY 11/17/19   Poggi, Excell Seltzer, MD  ibuprofen (ADVIL,MOTRIN) 600 MG tablet Take 1 tablet (600 mg total) by mouth every 6 (six) hours as needed. Patient not taking: No sig reported 01/26/18   Domenick Gong, MD  meclizine (ANTIVERT) 12.5 MG tablet Take 1-2 tablets (12.5-25 mg total) by mouth 3 (three) times daily as needed for dizziness. 01/14/21   Georgetta Haber, NP    Allergies Oxycodone-acetaminophen  Family History  Problem Relation Age of Onset   Diabetes Father    Hypertension Father    Prostate cancer Father  Social History Social History   Tobacco Use   Smoking status: Never   Smokeless tobacco: Never  Vaping Use   Vaping Use: Never used  Substance Use Topics   Alcohol use: No    Alcohol/week: 0.0 standard drinks   Drug use: No     Review of Systems  Constitutional: No fever/chills Eyes:  No discharge ENT: Patient has nasal congestion.  Respiratory: Patient has cough.  Gastrointestinal:   No nausea, no vomiting.  No diarrhea.  No  constipation. Musculoskeletal: Negative for musculoskeletal pain. Skin: Negative for rash, abrasions, lacerations, ecchymosis.    ____________________________________________   PHYSICAL EXAM:  VITAL SIGNS: ED Triage Vitals  Enc Vitals Group     BP 09/08/21 1316 112/80     Pulse Rate 09/08/21 1316 94     Resp 09/08/21 1316 14     Temp 09/08/21 1316 98.4 F (36.9 C)     Temp Source 09/08/21 1316 Oral     SpO2 09/08/21 1316 100 %     Weight 09/08/21 1311 240 lb (108.9 kg)     Height 09/08/21 1311 5' (1.524 m)     Head Circumference --      Peak Flow --      Pain Score 09/08/21 1311 0     Pain Loc --      Pain Edu? --      Excl. in GC? --      Constitutional: Alert and oriented. Patient is lying supine. Eyes: Conjunctivae are normal. PERRL. EOMI. Head: Atraumatic. ENT:      Ears: Tympanic membranes are mildly injected with mild effusion bilaterally.       Nose: No congestion/rhinnorhea.      Mouth/Throat: Mucous membranes are moist. Posterior pharynx is mildly erythematous.  Hematological/Lymphatic/Immunilogical: No cervical lymphadenopathy.  Cardiovascular: Normal rate, regular rhythm. Normal S1 and S2.  Good peripheral circulation. Respiratory: Normal respiratory effort without tachypnea or retractions. Lungs CTAB. Good air entry to the bases with no decreased or absent breath sounds. Gastrointestinal: Bowel sounds 4 quadrants. Soft and nontender to palpation. No guarding or rigidity. No palpable masses. No distention. No CVA tenderness. Musculoskeletal: Full range of motion to all extremities. No gross deformities appreciated. Neurologic:  Normal speech and language. No gross focal neurologic deficits are appreciated.  Skin:  Skin is warm, dry and intact. No rash noted. Psychiatric: Mood and affect are normal. Speech and behavior are normal. Patient exhibits appropriate insight and judgement.   ____________________________________________   LABS (all labs ordered  are listed, but only abnormal results are displayed)  Labs Reviewed  RESP PANEL BY RT-PCR (FLU A&B, COVID) [id number]   ____________________________________________  EKG   ____________________________________________  RADIOLOGY   No results found.  ____________________________________________    PROCEDURES  Procedure(s) performed:     Procedures     Medications - No data to display   ____________________________________________   INITIAL IMPRESSION / ASSESSMENT AND PLAN / ED COURSE  Pertinent labs & imaging results that were available during my care of the patient were reviewed by me and considered in my medical decision making (see chart for details).      Assessment and plan Viral upper respiratory tract infection 40 year old female presents to the emergency department with viral URI-like symptoms for 1 day.  COVID and flu testing were negative.  Rest and hydration were encouraged at home.  Tylenol and ibuprofen alternating were recommended for fever.     ____________________________________________  FINAL CLINICAL IMPRESSION(S) / ED DIAGNOSES  Final diagnoses:  Viral upper respiratory tract infection      NEW MEDICATIONS STARTED DURING THIS VISIT:  ED Discharge Orders     None           This chart was dictated using voice recognition software/Dragon. Despite best efforts to proofread, errors can occur which can change the meaning. Any change was purely unintentional.     Orvil Feil, PA-C 09/08/21 1526

## 2021-09-08 NOTE — Discharge Instructions (Signed)
You can take 10 mg of Zyrtec once daily. You can take 1 spray of Flonase, each side. Your COVID-19 and influenza results will post to MyChart.

## 2022-02-01 ENCOUNTER — Other Ambulatory Visit: Payer: Self-pay

## 2022-02-01 ENCOUNTER — Ambulatory Visit
Admission: RE | Admit: 2022-02-01 | Discharge: 2022-02-01 | Disposition: A | Discharge status: Home / Self Care | Payer: Managed Care, Other (non HMO) | Source: Ambulatory Visit | Attending: Emergency Medicine | Admitting: Emergency Medicine

## 2022-02-01 VITALS — BP 118/68 | HR 90 | Temp 98.5°F | Resp 18 | Ht 60.0 in | Wt 240.0 lb

## 2022-02-01 DIAGNOSIS — U071 COVID-19: Secondary | ICD-10-CM

## 2022-02-01 LAB — RESP PANEL BY RT-PCR (FLU A&B, COVID) ARPGX2
Influenza A by PCR: NEGATIVE
Influenza B by PCR: NEGATIVE
SARS Coronavirus 2 by RT PCR: POSITIVE — AB

## 2022-02-01 MED ORDER — PROMETHAZINE-DM 6.25-15 MG/5ML PO SYRP: 5.0000 mL | ORAL_SOLUTION | Freq: Four times a day (QID) | ORAL | 0 refills | Status: DC | PRN

## 2022-02-01 MED ORDER — MOLNUPIRAVIR EUA 200MG CAPSULE: 4.0000 | ORAL_CAPSULE | Freq: Two times a day (BID) | ORAL | 0 refills | Status: AC

## 2022-02-01 NOTE — ED Provider Notes (Signed)
HPI ? ?SUBJECTIVE: ? ?Sabrina Richards is a 41 y.o. female who presents with body aches headaches, cough, rhinorrhea, postnasal drip starting today. she had a positive home COVID test this morning, but needs confirmation.  Her husband was sick with COVID earlier this week.  No fevers, nasal congestion, sore throat, loss of sense of smell or taste, wheezing, shortness of breath, nausea, vomiting, diarrhea, abdominal pain.  She took 500 mg of Tylenol with improvement in her symptoms.  Her last dose of Tylenol was within 6 hours of evaluation.  No aggravating factors.  She has gotten 3 doses of the COVID-vaccine.  She has never had COVID before.  LMP: Denies the possibility of being pregnant.  PCP: [facility]  ? ? ?Past Medical History:  ?Diagnosis Date  ? Gestational diabetes   ? Hypothyroidism   ? PONV (postoperative nausea and vomiting)   ? Thyroid disease   ? ? ?Past Surgical History:  ?Procedure Laterality Date  ? ARM WOUND REPAIR / CLOSURE    ? Repair of dog bite  ? CESAREAN SECTION    ? CESAREAN SECTION N/A 03/22/2016  ? Procedure: CESAREAN SECTION;  Surgeon: Elenora Fender Ward, MD;  Location: [facility] ORS;  Service: Obstetrics;  Laterality: N/A;  ? ORIF SCAPHOID FRACTURE Left 11/17/2019  ? Procedure: EXCISION / DEBRIDEMENT WITH BONE GRAFTING OF AN INTRAOSSEOUS CYST OF LEFT SCAPHOID;  Surgeon: Christena Flake, MD;  Location: [facility] ORS;  Service: Orthopedics;  Laterality: Left;  ? ? ?Family History  ?Problem Relation Age of Onset  ? Diabetes Father   ? Hypertension Father   ? Prostate cancer Father   ? ? ?Social History  ? ?Tobacco Use  ? Smoking status: Never  ? Smokeless tobacco: Never  ?Vaping Use  ? Vaping Use: Never used  ?Substance Use Topics  ? Alcohol use: Yes  ? Drug use: No  ? ? ?No current facility-administered medications for this encounter. ? ?Current Outpatient Medications:  ?  busPIRone (BUSPAR) 10 MG tablet, Take 10 mg by mouth 2 (two) times daily., Disp: , Rfl:  ?  citalopram (CELEXA) 20 MG tablet, Take  20 mg by mouth daily., Disp: , Rfl:  ?  levothyroxine (SYNTHROID, LEVOTHROID) 50 MCG tablet, Take 50 mcg by mouth daily before breakfast., Disp: , Rfl:  ?  molnupiravir EUA (LAGEVRIO) 200 mg CAPS capsule, Take 4 capsules (800 mg total) by mouth 2 (two) times daily for 5 days., Disp: 40 capsule, Rfl: 0 ?  promethazine-dextromethorphan (PROMETHAZINE-DM) 6.25-15 MG/5ML syrup, Take 5 mLs by mouth 4 (four) times daily as needed for cough., Disp: 118 mL, Rfl: 0 ?  SPRINTEC 28 0.25-35 MG-MCG tablet, Take 1 tablet by mouth daily., Disp: , Rfl:  ?  fluticasone (FLONASE) 50 MCG/ACT nasal spray, Place 2 sprays into both nostrils daily. (Patient not taking: Reported on 11/09/2019), Disp: 16 g, Rfl: 0 ? ?Allergies  ?Allergen Reactions  ? Oxycodone-Acetaminophen Nausea And Vomiting  ? ? ? ?ROS ? ?As noted in HPI.  ? ?Physical Exam ? ?BP 118/68 (BP Location: Left Arm)   Pulse 90   Temp 98.5 ?F (36.9 ?C) (Oral)   Resp 18   Ht 5' (1.524 m)   Wt 108.9 kg   LMP 01/18/2022   SpO2 98%   BMI 46.87 kg/m?  ? ?Constitutional: Well developed, well nourished, no acute distress ?Eyes:  EOMI, conjunctiva normal bilaterally ?HENT: Normocephalic, atraumatic,mucus membranes moist.  Positive nasal congestion.  No maxillary, frontal sinus tenderness ?Neck: No cervical lymphadenopathy ?  Respiratory: Normal inspiratory effort.  Lungs clear bilaterally ?Cardiovascular: Normal rate regular rhythm, no murmurs, rubs, gallops ?GI: nondistended ?skin: No rash, skin intact ?Musculoskeletal: no deformities ?Neurologic: Alert & oriented x 3, no focal neuro deficits ?Psychiatric: Speech and behavior appropriate ? ? ?ED Course ? ? ?Medications - No data to display ? ?Orders Placed This Encounter  ?Procedures  ? Resp Panel by RT-PCR (Flu A&B, Covid) Nasopharyngeal Swab  ?  Standing Status:   Standing  ?  Number of Occurrences:   1  ? Airborne and Contact precautions  ?  Standing Status:   Standing  ?  Number of Occurrences:   1  ? ? ?Results for orders  placed or performed during the hospital encounter of 02/01/22 (from the past 24 hour(s))  ?Resp Panel by RT-PCR (Flu A&B, Covid) Nasopharyngeal Swab     Status: Abnormal  ? Collection Time: 02/01/22  4:26 PM  ? Specimen: Nasopharyngeal Swab; Nasopharyngeal(NP) swabs in vial transport medium  ?Result Value Ref Range  ? SARS Coronavirus 2 by RT PCR POSITIVE (A) NEGATIVE  ? Influenza A by PCR NEGATIVE NEGATIVE  ? Influenza B by PCR NEGATIVE NEGATIVE  ? ?No results found. ? ?ED Clinical Impression ? ?1. COVID-19 virus infection   ?  ? ?ED Assessment/Plan ? ?COVID PCR positive.  Prescribing Molnupiravir as patient is at increased risk for progression to severe disease based on BMI above 30.  Discussed importance of not getting pregnant for 3 months after finishing the medication. home with Promethazine DM, saline nasal irrigation, Flonase, Mucinex D, patient states that she does not need prescriptions for these.  Advised patient to get out walk a little bit every day to prevent PE and pneumonia, and will write a COVID work note for 5 days.  Follow-up with PCP as needed.  ER return precautions given. ? ?Discussed labs, treatment plan, and plan for follow-up with patient. Discussed sn/sx that should prompt return to the ED. patient agrees with plan.  ? ?Meds ordered this encounter  ?Medications  ? molnupiravir EUA (LAGEVRIO) 200 mg CAPS capsule  ?  Sig: Take 4 capsules (800 mg total) by mouth 2 (two) times daily for 5 days.  ?  Dispense:  40 capsule  ?  Refill:  0  ? promethazine-dextromethorphan (PROMETHAZINE-DM) 6.25-15 MG/5ML syrup  ?  Sig: Take 5 mLs by mouth 4 (four) times daily as needed for cough.  ?  Dispense:  118 mL  ?  Refill:  0  ? ? ? ? ?*This clinic note was created using Scientist, clinical (histocompatibility and immunogenetics). Therefore, there may be occasional mistakes despite careful proofreading. ? ?? ? ?  ?Domenick Gong, MD ?02/01/22 1734 ? ?

## 2022-02-01 NOTE — ED Triage Notes (Signed)
Pt c/o cough, body aches,  ? ?Pt took a home test and it was positive. Pt asks for a covid test to confirm.  ?

## 2022-02-01 NOTE — Discharge Instructions (Addendum)
quarantine for 5 more days.  Today is day #0.  You will need to wear a mask for an additional 5 days after finishing quarantine.  Continue Flonase, saline nasal irrigation, Mucinex D.  Finish the [employer], even if you feel better.  Take 600 mg of ibuprofen combined with 1000 mg of Tylenol 3-4 times a day as needed for body aches, headaches.  Promethazine DM as needed for cough ?

## 2022-05-14 ENCOUNTER — Ambulatory Visit
Admission: EM | Admit: 2022-05-14 | Discharge: 2022-05-14 | Disposition: A | Discharge status: Home / Self Care | Payer: Managed Care, Other (non HMO)

## 2022-05-14 DIAGNOSIS — M7989 Other specified soft tissue disorders: Secondary | ICD-10-CM

## 2022-05-14 NOTE — ED Triage Notes (Signed)
Patient present to UC for swollen left foot and ankle -- patient noticed it last night at church  and thought it was just a poor choice in shoes but it has continued.   Patient denies any injury to that foot and she states she has no hx of swelling.

## 2022-07-09 ENCOUNTER — Ambulatory Visit
Admission: RE | Admit: 2022-07-09 | Discharge: 2022-07-09 | Disposition: A | Discharge status: Home / Self Care | Payer: Managed Care, Other (non HMO) | Source: Ambulatory Visit | Attending: Emergency Medicine | Admitting: Emergency Medicine

## 2022-07-09 VITALS — BP 137/98 | HR 92 | Temp 98.3°F | Ht 60.0 in | Wt 260.0 lb

## 2022-07-09 DIAGNOSIS — J020 Streptococcal pharyngitis: Secondary | ICD-10-CM

## 2022-07-09 DIAGNOSIS — J069 Acute upper respiratory infection, unspecified: Secondary | ICD-10-CM

## 2022-07-09 DIAGNOSIS — H1031 Unspecified acute conjunctivitis, right eye: Secondary | ICD-10-CM

## 2022-07-09 LAB — GROUP A STREP BY PCR: Group A Strep by PCR: DETECTED — AB

## 2022-07-09 LAB — SARS CORONAVIRUS 2 BY RT PCR: SARS Coronavirus 2 by RT PCR: NEGATIVE

## 2022-07-09 MED ORDER — PROMETHAZINE-DM 6.25-15 MG/5ML PO SYRP: 5.0000 mL | ORAL_SOLUTION | Freq: Four times a day (QID) | ORAL | 0 refills | Status: DC | PRN

## 2022-07-09 MED ORDER — AMOXICILLIN-POT CLAVULANATE 875-125 MG PO TABS: 1.0000 | ORAL_TABLET | Freq: Two times a day (BID) | ORAL | 0 refills | Status: AC

## 2022-07-09 MED ORDER — BENZONATATE 100 MG PO CAPS: 200.0000 mg | ORAL_CAPSULE | Freq: Three times a day (TID) | ORAL | 0 refills | Status: DC

## 2022-07-09 MED ORDER — IPRATROPIUM BROMIDE 0.06 % NA SOLN: 2.0000 | Freq: Four times a day (QID) | NASAL | 12 refills | Status: DC

## 2022-07-09 MED ORDER — MOXIFLOXACIN HCL 0.5 % OP SOLN: 1.0000 [drp] | Freq: Three times a day (TID) | OPHTHALMIC | 0 refills | Status: AC

## 2022-07-09 NOTE — Discharge Instructions (Addendum)
Your COVID test today was negative in clinic but your strep test was positive.  Take the Augmentin twice daily for 10 days for treatment of your strep throat.  Gargle with warm salt water 2-3 times a day to soothe your throat, aid in pain relief, and aid in healing.  Take over-the-counter ibuprofen according to the package instructions as needed for pain.  You can also use Chloraseptic or Sucrets lozenges, 1 lozenge every 2 hours as needed for throat pain.  Your exam is also positive for attempted Vitas and I will treat you with an eyedrop called Vigamox.  Instill 1 drop of Vigamox in each eye every 8 hours for the next 7 days for treatment of your conjunctivitis.  Avoid touching your eyes as much as possible.  Wipe down all surfaces, countertops, and doorknobs after the first and second 24 hours on eyedrops.  Wash her face with a clean wash rag to remove any drainage and use a different portion of the wash rag to clean each eye so as to not reinfect yourself.  You do have upper respiratory inflammation and I will treat you with Atrovent nasal spray to help you with the nasal congestion, Tessalon Perles for use during the day for cough, and Promethazine DM cough syrup he can use at bedtime.  Use the Atrovent nasal spray, 2 squirts in each nostril every 6 hours, as needed for runny nose and postnasal drip.  Use the Tessalon Perles every 8 hours during the day.  Take them with a small sip of water.  They may give you some numbness to the base of your tongue or a metallic taste in your mouth, this is normal.  Use the Promethazine DM cough syrup at bedtime for cough and congestion.  It will make you drowsy so do not take it during the day.  If you develop any new or worsening symptoms return for reevaluation.

## 2022-07-09 NOTE — ED Provider Notes (Signed)
[facility]    CSN: [id number] Arrival date & time: 07/09/22  [id number]      History   Chief Complaint Chief Complaint  Patient presents with   Conjunctivitis   Sore Throat    HPI Sabrina Richards is a 41 y.o. female.   HPI  41 year old female here for evaluation of upper respiratory complaints.  Patient reports that 3 days ago she developed redness, yellow-green mucousy discharge, and itching in her right eye.  She denies any symptoms in her left eye.  She states for over a week she has been congested when she gets up in the morning but it resolves as the day goes on.  This morning she woke up with more significant congestion mainly on the right which resolved after taking a shower and getting up and moving.  She is also been experiencing a very sore throat, runny nose nasal congestion, headache, and a nonproductive cough.  She did take a home COVID test which was negative.  She denies any fever, ear pain, shortness breath, wheezing, GI complaints, or body aches.  Past Medical History:  Diagnosis Date   Gestational diabetes    Hypothyroidism    PONV (postoperative nausea and vomiting)    Thyroid disease     Patient Active Problem List   Diagnosis Date Noted   Labor and delivery, indication for care 03/24/2016   History of cesarean delivery, currently pregnant 03/22/2016   [redacted] weeks gestation of pregnancy 03/22/2016   Gestational diabetes mellitus (GDM) controlled on oral hypoglycemic drug, antepartum [id number]   Obesity complicating pregnancy [id number]   Rh negative, antepartum 03/22/2016   High-risk pregnancy in third trimester 03/22/2016   Postpartum care following cesarean delivery 03/22/2016   Hypothyroid in pregnancy, antepartum 03/22/2016    Past Surgical History:  Procedure Laterality Date   ARM WOUND REPAIR / CLOSURE     Repair of dog bite   CESAREAN SECTION     CESAREAN SECTION N/A 03/22/2016   Procedure: CESAREAN SECTION;  Surgeon: Honor Loh Ward,  MD;  Location: [facility] ORS;  Service: Obstetrics;  Laterality: N/A;   ORIF SCAPHOID FRACTURE Left 11/17/2019   Procedure: EXCISION / DEBRIDEMENT WITH BONE GRAFTING OF AN INTRAOSSEOUS CYST OF LEFT SCAPHOID;  Surgeon: Corky Mull, MD;  Location: [facility] ORS;  Service: Orthopedics;  Laterality: Left;    OB History     Gravida  3   Para  2   Term  2   Preterm      AB  1   Living  2      SAB      IAB      Ectopic      Multiple  0   Live Births  2            Home Medications    Prior to Admission medications   Medication Sig Start Date End Date Taking? Authorizing Provider  amoxicillin-clavulanate (AUGMENTIN) 875-125 MG tablet Take 1 tablet by mouth every 12 (twelve) hours for 10 days. 07/09/22 07/19/22 Yes Margarette Canada, NP  benzonatate (TESSALON) 100 MG capsule Take 2 capsules (200 mg total) by mouth every 8 (eight) hours. 07/09/22  Yes Margarette Canada, NP  fluticasone (FLONASE) 50 MCG/ACT nasal spray Place 2 sprays into both nostrils daily. 01/26/18  Yes Melynda Ripple, MD  ipratropium (ATROVENT) 0.06 % nasal spray Place 2 sprays into both nostrils 4 (four) times daily. 07/09/22  Yes Margarette Canada, NP  levothyroxine (SYNTHROID, LEVOTHROID) 50 MCG tablet Take  50 mcg by mouth daily before breakfast.   Yes [provider]  moxifloxacin (VIGAMOX) 0.5 % ophthalmic solution Place 1 drop into both eyes 3 (three) times daily for 7 days. 07/09/22 07/16/22 Yes Becky Augusta, NP  promethazine-dextromethorphan (PROMETHAZINE-DM) 6.25-15 MG/5ML syrup Take 5 mLs by mouth 4 (four) times daily as needed. 07/09/22  Yes Becky Augusta, NP    Family History Family History  Problem Relation Age of Onset   Diabetes Father    Hypertension Father    Prostate cancer Father     Social History Social History   Tobacco Use   Smoking status: Never   Smokeless tobacco: Never  Vaping Use   Vaping Use: Never used  Substance Use Topics   Alcohol use: Yes   Drug use: No     Allergies    Oxycodone-acetaminophen   Review of Systems Review of Systems  Constitutional:  Negative for fever.  HENT:  Positive for congestion, rhinorrhea and sore throat. Negative for ear pain.   Eyes:  Positive for discharge, redness and itching. Negative for photophobia, pain and visual disturbance.  Respiratory:  Positive for cough. Negative for shortness of breath and wheezing.   Gastrointestinal:  Negative for diarrhea, nausea and vomiting.  Musculoskeletal:  Negative for arthralgias and myalgias.  Skin:  Negative for rash.  Neurological:  Positive for headaches.  Hematological: Negative.   Psychiatric/Behavioral: Negative.       Physical Exam Triage Vital Signs ED Triage Vitals  Enc Vitals Group     BP 07/09/22 1027 (!) 137/98     Pulse Rate 07/09/22 1027 92     Resp --      Temp 07/09/22 1027 98.3 F (36.8 C)     Temp Source 07/09/22 1027 Oral     SpO2 07/09/22 1027 100 %     Weight 07/09/22 1025 260 lb (117.9 kg)     Height 07/09/22 1025 5' (1.524 m)     Head Circumference --      Peak Flow --      Pain Score 07/09/22 1024 3     Pain Loc --      Pain Edu? --      Excl. in GC? --    No data found.  Updated Vital Signs BP (!) 137/98 (BP Location: Left Arm)   Pulse 92   Temp 98.3 F (36.8 C) (Oral)   Ht 5' (1.524 m)   Wt 260 lb (117.9 kg)   LMP 07/09/2022 (Exact Date)   SpO2 100%   BMI 50.78 kg/m   Visual Acuity Right Eye Distance: 20/25 corrected Left Eye Distance: 20/25 corrected Bilateral Distance: 20/20 corrected  Right Eye Near:   Left Eye Near:    Bilateral Near:     Physical Exam Vitals and nursing note reviewed.  Constitutional:      Appearance: Normal appearance. She is not ill-appearing.  HENT:     Head: Normocephalic and atraumatic.     Right Ear: Tympanic membrane, ear canal and external ear normal. There is no impacted cerumen.     Left Ear: Tympanic membrane, ear canal and external ear normal. There is no impacted cerumen.     Nose:  Congestion and rhinorrhea present.     Mouth/Throat:     Mouth: Mucous membranes are moist.     Pharynx: Oropharynx is clear. Posterior oropharyngeal erythema present. No oropharyngeal exudate.  Eyes:     General: No scleral icterus.       Right eye:  Discharge present.     Extraocular Movements: Extraocular movements intact.     Pupils: Pupils are equal, round, and reactive to light.  Cardiovascular:     Rate and Rhythm: Normal rate and regular rhythm.     Pulses: Normal pulses.     Heart sounds: Normal heart sounds. No murmur heard.    No friction rub. No gallop.  Pulmonary:     Effort: Pulmonary effort is normal.     Breath sounds: Normal breath sounds. No wheezing, rhonchi or rales.  Musculoskeletal:     Cervical back: Normal range of motion and neck supple.  Lymphadenopathy:     Cervical: No cervical adenopathy.  Skin:    General: Skin is warm and dry.     Capillary Refill: Capillary refill takes less than 2 seconds.     Findings: No erythema or rash.  Neurological:     General: No focal deficit present.     Mental Status: She is alert and oriented to person, place, and time.  Psychiatric:        Mood and Affect: Mood normal.        Behavior: Behavior normal.        Thought Content: Thought content normal.        Judgment: Judgment normal.      UC Treatments / Results  Labs (all labs ordered are listed, but only abnormal results are displayed) Labs Reviewed  GROUP A STREP BY PCR - Abnormal; Notable for the following components:      Result Value   Group A Strep by PCR DETECTED (*)    All other components within normal limits  SARS CORONAVIRUS 2 BY RT PCR    EKG   Radiology No results found.  Procedures Procedures (including critical care time)  Medications Ordered in UC Medications - No data to display  Initial Impression / Assessment and Plan / UC Course  I have reviewed the triage vital signs and the nursing notes.  Pertinent labs & imaging results  that were available during my care of the patient were reviewed by me and considered in my medical decision making (see chart for details).   Patient is a very pleasant, nontoxic-appearing 41 year old female here for evaluation of right eye discharge, itching, and redness, sore throat, and other upper respiratory symptoms as outlined in HPI above.  Her eye complaint is been going on for last 3 days.  Congestion has been for weeks but is worsened in the last 1 to 2 days and patient also has a significantly sore throat.  Physical exam reveals pearly-gray tympanic membranes bilaterally with normal light reflex and clear external auditory canals.  Nasal mucosa is erythematous and edematous with scant clear discharge in both nares.  Oropharyngeal exam reveals posterior oropharyngeal erythema and injection with clear postnasal drip.  Bilateral tonsillar pillars are also edematous and erythematous but no exudate appreciated.  No cervical lymphadenopathy appreciable exam.  Cardiopulmonary exam reveals S1-S2 heart sounds with regular rate and rhythm and lung sounds that are clear to auscultation all fields.  Even the patient took a home COVID test that was negative I will check COVID PCR and strep PCR here in clinic.  Strep PCR is positive.  COVID PCR is negative.  I will discharge patient home with a diagnosis of strep pharyngitis and also right-sided conjunctivitis.  I will treat her conjunctivitis with Vigamox and her strep throat with Augmentin.   Final Clinical Impressions(s) / UC Diagnoses   Final diagnoses:  Strep pharyngitis  Upper respiratory tract infection, unspecified type  Acute bacterial conjunctivitis of right eye     Discharge Instructions      Your COVID test today was negative in clinic but your strep test was positive.  Take the Augmentin twice daily for 10 days for treatment of your strep throat.  Gargle with warm salt water 2-3 times a day to soothe your throat, aid in pain  relief, and aid in healing.  Take over-the-counter ibuprofen according to the package instructions as needed for pain.  You can also use Chloraseptic or Sucrets lozenges, 1 lozenge every 2 hours as needed for throat pain.  Your exam is also positive for attempted Vitas and I will treat you with an eyedrop called Vigamox.  Instill 1 drop of Vigamox in each eye every 8 hours for the next 7 days for treatment of your conjunctivitis.  Avoid touching your eyes as much as possible.  Wipe down all surfaces, countertops, and doorknobs after the first and second 24 hours on eyedrops.  Wash her face with a clean wash rag to remove any drainage and use a different portion of the wash rag to clean each eye so as to not reinfect yourself.  You do have upper respiratory inflammation and I will treat you with Atrovent nasal spray to help you with the nasal congestion, Tessalon Perles for use during the day for cough, and Promethazine DM cough syrup he can use at bedtime.  Use the Atrovent nasal spray, 2 squirts in each nostril every 6 hours, as needed for runny nose and postnasal drip.  Use the Tessalon Perles every 8 hours during the day.  Take them with a small sip of water.  They may give you some numbness to the base of your tongue or a metallic taste in your mouth, this is normal.  Use the Promethazine DM cough syrup at bedtime for cough and congestion.  It will make you drowsy so do not take it during the day.  If you develop any new or worsening symptoms return for reevaluation.      ED Prescriptions     Medication Sig Dispense Auth. Provider   benzonatate (TESSALON) 100 MG capsule Take 2 capsules (200 mg total) by mouth every 8 (eight) hours. 21 capsule Margarette Canada, NP   ipratropium (ATROVENT) 0.06 % nasal spray Place 2 sprays into both nostrils 4 (four) times daily. 15 mL Margarette Canada, NP   promethazine-dextromethorphan (PROMETHAZINE-DM) 6.25-15 MG/5ML syrup Take 5 mLs by mouth 4 (four)  times daily as needed. 118 mL Margarette Canada, NP   amoxicillin-clavulanate (AUGMENTIN) 875-125 MG tablet Take 1 tablet by mouth every 12 (twelve) hours for 10 days. 20 tablet Margarette Canada, NP   moxifloxacin (VIGAMOX) 0.5 % ophthalmic solution Place 1 drop into both eyes 3 (three) times daily for 7 days. 3 mL Margarette Canada, NP      PDMP not reviewed this encounter.   Margarette Canada, NP 07/09/22 1133

## 2022-07-09 NOTE — ED Triage Notes (Signed)
Patient reports that last Friday she noticed that her eye was pink and had some yellow and green discharge.   Patient reports that her throat is very sore as well.   Patient did take an @home  COVID test and it was negative.

## 2023-03-19 ENCOUNTER — Other Ambulatory Visit: Payer: Self-pay | Admitting: Certified Nurse Midwife

## 2023-03-19 DIAGNOSIS — O24111 Pre-existing diabetes mellitus, type 2, in pregnancy, first trimester: Secondary | ICD-10-CM

## 2023-03-19 DIAGNOSIS — Z363 Encounter for antenatal screening for malformations: Secondary | ICD-10-CM

## 2023-03-19 DIAGNOSIS — O09521 Supervision of elderly multigravida, first trimester: Secondary | ICD-10-CM

## 2023-04-08 DIAGNOSIS — O0993 Supervision of high risk pregnancy, unspecified, third trimester: Secondary | ICD-10-CM | POA: Insufficient documentation

## 2023-05-31 ENCOUNTER — Encounter: Payer: Self-pay | Admitting: *Deleted

## 2023-05-31 DIAGNOSIS — O09529 Supervision of elderly multigravida, unspecified trimester: Secondary | ICD-10-CM | POA: Insufficient documentation

## 2023-05-31 DIAGNOSIS — E119 Type 2 diabetes mellitus without complications: Secondary | ICD-10-CM | POA: Insufficient documentation

## 2023-06-06 ENCOUNTER — Other Ambulatory Visit: Payer: Self-pay | Admitting: *Deleted

## 2023-06-06 ENCOUNTER — Ambulatory Visit: Payer: Managed Care, Other (non HMO) | Attending: Certified Nurse Midwife

## 2023-06-06 ENCOUNTER — Ambulatory Visit: Payer: Managed Care, Other (non HMO) | Admitting: *Deleted

## 2023-06-06 ENCOUNTER — Encounter: Payer: Self-pay | Admitting: *Deleted

## 2023-06-06 VITALS — BP 117/62 | HR 78

## 2023-06-06 DIAGNOSIS — O34219 Maternal care for unspecified type scar from previous cesarean delivery: Secondary | ICD-10-CM

## 2023-06-06 DIAGNOSIS — E039 Hypothyroidism, unspecified: Secondary | ICD-10-CM

## 2023-06-06 DIAGNOSIS — O4442 Low lying placenta NOS or without hemorrhage, second trimester: Secondary | ICD-10-CM

## 2023-06-06 DIAGNOSIS — O24112 Pre-existing diabetes mellitus, type 2, in pregnancy, second trimester: Secondary | ICD-10-CM

## 2023-06-06 DIAGNOSIS — Z3A19 19 weeks gestation of pregnancy: Secondary | ICD-10-CM

## 2023-06-06 DIAGNOSIS — E669 Obesity, unspecified: Secondary | ICD-10-CM

## 2023-06-06 DIAGNOSIS — O99212 Obesity complicating pregnancy, second trimester: Secondary | ICD-10-CM

## 2023-06-06 DIAGNOSIS — Z362 Encounter for other antenatal screening follow-up: Secondary | ICD-10-CM

## 2023-06-06 DIAGNOSIS — Z363 Encounter for antenatal screening for malformations: Secondary | ICD-10-CM | POA: Insufficient documentation

## 2023-06-06 DIAGNOSIS — O360120 Maternal care for anti-D [Rh] antibodies, second trimester, not applicable or unspecified: Secondary | ICD-10-CM | POA: Diagnosis not present

## 2023-06-06 DIAGNOSIS — E1169 Type 2 diabetes mellitus with other specified complication: Secondary | ICD-10-CM | POA: Diagnosis not present

## 2023-06-06 DIAGNOSIS — O09521 Supervision of elderly multigravida, first trimester: Secondary | ICD-10-CM | POA: Insufficient documentation

## 2023-06-06 DIAGNOSIS — O24111 Pre-existing diabetes mellitus, type 2, in pregnancy, first trimester: Secondary | ICD-10-CM | POA: Diagnosis present

## 2023-06-06 DIAGNOSIS — O99282 Endocrine, nutritional and metabolic diseases complicating pregnancy, second trimester: Secondary | ICD-10-CM

## 2023-06-06 DIAGNOSIS — O09522 Supervision of elderly multigravida, second trimester: Secondary | ICD-10-CM

## 2023-06-06 DIAGNOSIS — O09292 Supervision of pregnancy with other poor reproductive or obstetric history, second trimester: Secondary | ICD-10-CM

## 2023-07-10 ENCOUNTER — Other Ambulatory Visit: Payer: Self-pay

## 2023-07-10 ENCOUNTER — Ambulatory Visit: Payer: Managed Care, Other (non HMO) | Attending: Maternal & Fetal Medicine

## 2023-07-10 DIAGNOSIS — O24112 Pre-existing diabetes mellitus, type 2, in pregnancy, second trimester: Secondary | ICD-10-CM | POA: Diagnosis not present

## 2023-07-10 DIAGNOSIS — O360120 Maternal care for anti-D [Rh] antibodies, second trimester, not applicable or unspecified: Secondary | ICD-10-CM

## 2023-07-10 DIAGNOSIS — O4442 Low lying placenta NOS or without hemorrhage, second trimester: Secondary | ICD-10-CM

## 2023-07-10 DIAGNOSIS — O09292 Supervision of pregnancy with other poor reproductive or obstetric history, second trimester: Secondary | ICD-10-CM

## 2023-07-10 DIAGNOSIS — Z3A23 23 weeks gestation of pregnancy: Secondary | ICD-10-CM

## 2023-07-10 DIAGNOSIS — O99282 Endocrine, nutritional and metabolic diseases complicating pregnancy, second trimester: Secondary | ICD-10-CM

## 2023-07-10 DIAGNOSIS — O99212 Obesity complicating pregnancy, second trimester: Secondary | ICD-10-CM

## 2023-07-10 DIAGNOSIS — E039 Hypothyroidism, unspecified: Secondary | ICD-10-CM

## 2023-07-10 DIAGNOSIS — O34219 Maternal care for unspecified type scar from previous cesarean delivery: Secondary | ICD-10-CM

## 2023-07-10 DIAGNOSIS — E669 Obesity, unspecified: Secondary | ICD-10-CM

## 2023-07-10 DIAGNOSIS — E1169 Type 2 diabetes mellitus with other specified complication: Secondary | ICD-10-CM | POA: Diagnosis not present

## 2023-07-10 DIAGNOSIS — Z362 Encounter for other antenatal screening follow-up: Secondary | ICD-10-CM

## 2023-07-10 DIAGNOSIS — O09522 Supervision of elderly multigravida, second trimester: Secondary | ICD-10-CM

## 2023-08-05 ENCOUNTER — Other Ambulatory Visit: Payer: Self-pay

## 2023-08-05 DIAGNOSIS — O99213 Obesity complicating pregnancy, third trimester: Secondary | ICD-10-CM

## 2023-08-05 DIAGNOSIS — O34219 Maternal care for unspecified type scar from previous cesarean delivery: Secondary | ICD-10-CM

## 2023-08-05 DIAGNOSIS — Z6791 Unspecified blood type, Rh negative: Secondary | ICD-10-CM

## 2023-08-05 DIAGNOSIS — O09523 Supervision of elderly multigravida, third trimester: Secondary | ICD-10-CM

## 2023-08-05 DIAGNOSIS — E039 Hypothyroidism, unspecified: Secondary | ICD-10-CM

## 2023-08-07 ENCOUNTER — Ambulatory Visit: Payer: Managed Care, Other (non HMO) | Attending: Obstetrics and Gynecology

## 2023-08-07 ENCOUNTER — Other Ambulatory Visit: Payer: Self-pay

## 2023-08-07 DIAGNOSIS — E039 Hypothyroidism, unspecified: Secondary | ICD-10-CM | POA: Diagnosis not present

## 2023-08-07 DIAGNOSIS — O99282 Endocrine, nutritional and metabolic diseases complicating pregnancy, second trimester: Secondary | ICD-10-CM | POA: Diagnosis not present

## 2023-08-07 DIAGNOSIS — O09522 Supervision of elderly multigravida, second trimester: Secondary | ICD-10-CM | POA: Diagnosis not present

## 2023-08-07 DIAGNOSIS — O09292 Supervision of pregnancy with other poor reproductive or obstetric history, second trimester: Secondary | ICD-10-CM

## 2023-08-07 DIAGNOSIS — O24112 Pre-existing diabetes mellitus, type 2, in pregnancy, second trimester: Secondary | ICD-10-CM

## 2023-08-07 DIAGNOSIS — O4442 Low lying placenta NOS or without hemorrhage, second trimester: Secondary | ICD-10-CM

## 2023-08-07 DIAGNOSIS — Z362 Encounter for other antenatal screening follow-up: Secondary | ICD-10-CM

## 2023-08-07 DIAGNOSIS — E669 Obesity, unspecified: Secondary | ICD-10-CM

## 2023-08-07 DIAGNOSIS — O99212 Obesity complicating pregnancy, second trimester: Secondary | ICD-10-CM

## 2023-08-07 DIAGNOSIS — O34219 Maternal care for unspecified type scar from previous cesarean delivery: Secondary | ICD-10-CM

## 2023-08-07 DIAGNOSIS — O360120 Maternal care for anti-D [Rh] antibodies, second trimester, not applicable or unspecified: Secondary | ICD-10-CM

## 2023-08-07 DIAGNOSIS — Z3A27 27 weeks gestation of pregnancy: Secondary | ICD-10-CM | POA: Diagnosis not present

## 2023-08-07 DIAGNOSIS — E1169 Type 2 diabetes mellitus with other specified complication: Secondary | ICD-10-CM | POA: Diagnosis not present

## 2023-08-28 ENCOUNTER — Other Ambulatory Visit: Payer: Self-pay | Admitting: Obstetrics and Gynecology

## 2023-08-28 DIAGNOSIS — O99280 Endocrine, nutritional and metabolic diseases complicating pregnancy, unspecified trimester: Secondary | ICD-10-CM

## 2023-08-28 DIAGNOSIS — O26899 Other specified pregnancy related conditions, unspecified trimester: Secondary | ICD-10-CM

## 2023-08-28 DIAGNOSIS — O99213 Obesity complicating pregnancy, third trimester: Secondary | ICD-10-CM

## 2023-08-28 DIAGNOSIS — O34219 Maternal care for unspecified type scar from previous cesarean delivery: Secondary | ICD-10-CM

## 2023-08-28 DIAGNOSIS — O09523 Supervision of elderly multigravida, third trimester: Secondary | ICD-10-CM

## 2023-09-11 ENCOUNTER — Other Ambulatory Visit: Payer: Self-pay

## 2023-09-11 ENCOUNTER — Ambulatory Visit: Payer: Managed Care, Other (non HMO) | Attending: Obstetrics and Gynecology

## 2023-09-11 DIAGNOSIS — O09293 Supervision of pregnancy with other poor reproductive or obstetric history, third trimester: Secondary | ICD-10-CM

## 2023-09-11 DIAGNOSIS — Z794 Long term (current) use of insulin: Secondary | ICD-10-CM

## 2023-09-11 DIAGNOSIS — O99283 Endocrine, nutritional and metabolic diseases complicating pregnancy, third trimester: Secondary | ICD-10-CM

## 2023-09-11 DIAGNOSIS — O34219 Maternal care for unspecified type scar from previous cesarean delivery: Secondary | ICD-10-CM | POA: Insufficient documentation

## 2023-09-11 DIAGNOSIS — O99213 Obesity complicating pregnancy, third trimester: Secondary | ICD-10-CM | POA: Diagnosis present

## 2023-09-11 DIAGNOSIS — E039 Hypothyroidism, unspecified: Secondary | ICD-10-CM | POA: Insufficient documentation

## 2023-09-11 DIAGNOSIS — O360130 Maternal care for anti-D [Rh] antibodies, third trimester, not applicable or unspecified: Secondary | ICD-10-CM

## 2023-09-11 DIAGNOSIS — O24113 Pre-existing diabetes mellitus, type 2, in pregnancy, third trimester: Secondary | ICD-10-CM

## 2023-09-11 DIAGNOSIS — Z6791 Unspecified blood type, Rh negative: Secondary | ICD-10-CM | POA: Diagnosis present

## 2023-09-11 DIAGNOSIS — O09523 Supervision of elderly multigravida, third trimester: Secondary | ICD-10-CM | POA: Diagnosis present

## 2023-09-11 DIAGNOSIS — E119 Type 2 diabetes mellitus without complications: Secondary | ICD-10-CM

## 2023-09-11 DIAGNOSIS — O99280 Endocrine, nutritional and metabolic diseases complicating pregnancy, unspecified trimester: Secondary | ICD-10-CM | POA: Diagnosis present

## 2023-09-11 DIAGNOSIS — O26899 Other specified pregnancy related conditions, unspecified trimester: Secondary | ICD-10-CM | POA: Diagnosis present

## 2023-09-11 DIAGNOSIS — Z3A32 32 weeks gestation of pregnancy: Secondary | ICD-10-CM

## 2023-09-11 DIAGNOSIS — E669 Obesity, unspecified: Secondary | ICD-10-CM

## 2023-09-18 ENCOUNTER — Other Ambulatory Visit: Payer: Self-pay

## 2023-09-18 ENCOUNTER — Ambulatory Visit: Payer: Managed Care, Other (non HMO) | Attending: Obstetrics and Gynecology

## 2023-09-18 DIAGNOSIS — E119 Type 2 diabetes mellitus without complications: Secondary | ICD-10-CM

## 2023-09-18 DIAGNOSIS — Z6791 Unspecified blood type, Rh negative: Secondary | ICD-10-CM | POA: Insufficient documentation

## 2023-09-18 DIAGNOSIS — O26893 Other specified pregnancy related conditions, third trimester: Secondary | ICD-10-CM | POA: Diagnosis not present

## 2023-09-18 DIAGNOSIS — O09523 Supervision of elderly multigravida, third trimester: Secondary | ICD-10-CM | POA: Insufficient documentation

## 2023-09-18 DIAGNOSIS — E669 Obesity, unspecified: Secondary | ICD-10-CM

## 2023-09-18 DIAGNOSIS — O99280 Endocrine, nutritional and metabolic diseases complicating pregnancy, unspecified trimester: Secondary | ICD-10-CM | POA: Diagnosis present

## 2023-09-18 DIAGNOSIS — O09293 Supervision of pregnancy with other poor reproductive or obstetric history, third trimester: Secondary | ICD-10-CM | POA: Insufficient documentation

## 2023-09-18 DIAGNOSIS — O34219 Maternal care for unspecified type scar from previous cesarean delivery: Secondary | ICD-10-CM | POA: Diagnosis present

## 2023-09-18 DIAGNOSIS — O4443 Low lying placenta NOS or without hemorrhage, third trimester: Secondary | ICD-10-CM | POA: Diagnosis not present

## 2023-09-18 DIAGNOSIS — O24113 Pre-existing diabetes mellitus, type 2, in pregnancy, third trimester: Secondary | ICD-10-CM | POA: Diagnosis not present

## 2023-09-18 DIAGNOSIS — O99213 Obesity complicating pregnancy, third trimester: Secondary | ICD-10-CM | POA: Diagnosis not present

## 2023-09-18 DIAGNOSIS — E039 Hypothyroidism, unspecified: Secondary | ICD-10-CM | POA: Diagnosis present

## 2023-09-18 DIAGNOSIS — O26899 Other specified pregnancy related conditions, unspecified trimester: Secondary | ICD-10-CM

## 2023-09-18 DIAGNOSIS — Z3A33 33 weeks gestation of pregnancy: Secondary | ICD-10-CM | POA: Insufficient documentation

## 2023-09-18 DIAGNOSIS — O360130 Maternal care for anti-D [Rh] antibodies, third trimester, not applicable or unspecified: Secondary | ICD-10-CM | POA: Diagnosis not present

## 2023-09-18 DIAGNOSIS — O99283 Endocrine, nutritional and metabolic diseases complicating pregnancy, third trimester: Secondary | ICD-10-CM | POA: Diagnosis not present

## 2023-09-19 ENCOUNTER — Other Ambulatory Visit: Payer: Self-pay | Admitting: Maternal & Fetal Medicine

## 2023-09-19 DIAGNOSIS — O09523 Supervision of elderly multigravida, third trimester: Secondary | ICD-10-CM

## 2023-09-19 DIAGNOSIS — O99280 Endocrine, nutritional and metabolic diseases complicating pregnancy, unspecified trimester: Secondary | ICD-10-CM

## 2023-09-19 DIAGNOSIS — O26899 Other specified pregnancy related conditions, unspecified trimester: Secondary | ICD-10-CM

## 2023-09-19 DIAGNOSIS — O34219 Maternal care for unspecified type scar from previous cesarean delivery: Secondary | ICD-10-CM

## 2023-09-19 DIAGNOSIS — O99213 Obesity complicating pregnancy, third trimester: Secondary | ICD-10-CM

## 2023-09-25 ENCOUNTER — Encounter
Admission: RE | Admit: 2023-09-25 | Discharge: 2023-09-25 | Disposition: A | Discharge status: Home / Self Care | Payer: Managed Care, Other (non HMO) | Source: Ambulatory Visit | Attending: Anesthesiology | Admitting: Anesthesiology

## 2023-09-25 ENCOUNTER — Ambulatory Visit: Payer: Managed Care, Other (non HMO) | Attending: Obstetrics and Gynecology

## 2023-09-25 ENCOUNTER — Other Ambulatory Visit: Payer: Self-pay

## 2023-09-25 DIAGNOSIS — O34219 Maternal care for unspecified type scar from previous cesarean delivery: Secondary | ICD-10-CM | POA: Diagnosis not present

## 2023-09-25 DIAGNOSIS — O360130 Maternal care for anti-D [Rh] antibodies, third trimester, not applicable or unspecified: Secondary | ICD-10-CM

## 2023-09-25 DIAGNOSIS — E669 Obesity, unspecified: Secondary | ICD-10-CM

## 2023-09-25 DIAGNOSIS — O99283 Endocrine, nutritional and metabolic diseases complicating pregnancy, third trimester: Secondary | ICD-10-CM | POA: Diagnosis not present

## 2023-09-25 DIAGNOSIS — O09523 Supervision of elderly multigravida, third trimester: Secondary | ICD-10-CM | POA: Diagnosis not present

## 2023-09-25 DIAGNOSIS — O360190 Maternal care for anti-D [Rh] antibodies, unspecified trimester, not applicable or unspecified: Secondary | ICD-10-CM | POA: Diagnosis not present

## 2023-09-25 DIAGNOSIS — O99280 Endocrine, nutritional and metabolic diseases complicating pregnancy, unspecified trimester: Secondary | ICD-10-CM | POA: Diagnosis present

## 2023-09-25 DIAGNOSIS — O99213 Obesity complicating pregnancy, third trimester: Secondary | ICD-10-CM | POA: Diagnosis not present

## 2023-09-25 DIAGNOSIS — Z794 Long term (current) use of insulin: Secondary | ICD-10-CM | POA: Insufficient documentation

## 2023-09-25 DIAGNOSIS — E039 Hypothyroidism, unspecified: Secondary | ICD-10-CM | POA: Diagnosis not present

## 2023-09-25 DIAGNOSIS — O26893 Other specified pregnancy related conditions, third trimester: Secondary | ICD-10-CM | POA: Diagnosis not present

## 2023-09-25 DIAGNOSIS — O09293 Supervision of pregnancy with other poor reproductive or obstetric history, third trimester: Secondary | ICD-10-CM | POA: Insufficient documentation

## 2023-09-25 DIAGNOSIS — E1169 Type 2 diabetes mellitus with other specified complication: Secondary | ICD-10-CM

## 2023-09-25 DIAGNOSIS — Z6791 Unspecified blood type, Rh negative: Secondary | ICD-10-CM | POA: Diagnosis not present

## 2023-09-25 DIAGNOSIS — O24113 Pre-existing diabetes mellitus, type 2, in pregnancy, third trimester: Secondary | ICD-10-CM | POA: Insufficient documentation

## 2023-09-25 DIAGNOSIS — Z362 Encounter for other antenatal screening follow-up: Secondary | ICD-10-CM | POA: Diagnosis not present

## 2023-09-25 DIAGNOSIS — Z3A34 34 weeks gestation of pregnancy: Secondary | ICD-10-CM | POA: Insufficient documentation

## 2023-09-25 DIAGNOSIS — O4443 Low lying placenta NOS or without hemorrhage, third trimester: Secondary | ICD-10-CM

## 2023-09-25 DIAGNOSIS — O26899 Other specified pregnancy related conditions, unspecified trimester: Secondary | ICD-10-CM

## 2023-09-25 NOTE — Consult Note (Signed)
[facility] Anesthesia Consultation  DEREONNA LENSING [id number] DOB: 09-Oct-1981 DOA: 09/25/2023 PCP: Cyndie Mull, DO   Requesting physician: Dr. Jean Rosenthal Date of consultation: 09/25/2023 Reason for consultation: Obesity during pregnancy  CHIEF COMPLAINT:  Obesity during pregnancy  HISTORY OF PRESENT ILLNESS: Yanique Mulvihill  is a 42 y.o. female with a known history of obesity BMI 54, hypothyroid, DM  PAST MEDICAL HISTORY:   Past Medical History:  Diagnosis Date   [redacted] weeks gestation of pregnancy 03/22/2016   Gestational diabetes    High-risk pregnancy in third trimester 03/22/2016   Hypothyroidism    Labor and delivery, indication for care 03/24/2016   PONV (postoperative nausea and vomiting)    Postpartum care following cesarean delivery 03/22/2016   Thyroid disease     PAST SURGICAL HISTORY:  Past Surgical History:  Procedure Laterality Date   ARM WOUND REPAIR / CLOSURE     Repair of dog bite   CESAREAN SECTION     CESAREAN SECTION N/A 03/22/2016   Procedure: CESAREAN SECTION;  Surgeon: Elenora Fender Ward, MD;  Location: [facility] ORS;  Service: Obstetrics;  Laterality: N/A;   ORIF SCAPHOID FRACTURE Left 11/17/2019   Procedure: EXCISION / DEBRIDEMENT WITH BONE GRAFTING OF AN INTRAOSSEOUS CYST OF LEFT SCAPHOID;  Surgeon: Christena Flake, MD;  Location: [facility] ORS;  Service: Orthopedics;  Laterality: Left;    SOCIAL HISTORY:  Social History   Tobacco Use   Smoking status: Never   Smokeless tobacco: Never  Substance Use Topics   Alcohol use: Not Currently    FAMILY HISTORY:  Family History  Problem Relation Age of Onset   Diabetes Father    Hypertension Father    Prostate cancer Father     DRUG ALLERGIES:  Allergies  Allergen Reactions   Oxycodone-Acetaminophen Nausea And Vomiting    REVIEW OF SYSTEMS:   RESPIRATORY: No cough, shortness of breath, wheezing.  CARDIOVASCULAR: No chest pain, orthopnea, edema.  HEMATOLOGY:  No anemia, easy bruising or bleeding SKIN: No rash or lesion. NEUROLOGIC: No tingling, numbness, weakness.  PSYCHIATRY: No anxiety or depression.   MEDICATIONS AT HOME:  Prior to Admission medications   Medication Sig Start Date End Date Taking? Authorizing Provider  aspirin EC 81 MG tablet Take 81 mg by mouth daily. Swallow whole.    [provider]  insulin NPH Human (NOVOLIN N) 100 UNIT/ML injection Inject 30 Units into the skin in the morning. 18 u at [facility]    [provider]  insulin regular (NOVOLIN R) 100 units/mL injection Inject 15 Units into the skin in the morning. 12 u at [facility]    [provider]  levothyroxine (SYNTHROID, LEVOTHROID) 50 MCG tablet Take 50 mcg by mouth daily before breakfast.    [provider]  Prenatal Vit-Fe Fumarate-FA (PRENATAL MULTIVITAMIN) TABS tablet Take 1 tablet by mouth daily at 12 noon.    [provider]      PHYSICAL EXAMINATION:   VITAL SIGNS: Height 5' (1.524 m), weight 125.7 kg, last menstrual period 01/24/2023.  GENERAL:  42 y.o.-year-old patient no acute distress.  HEENT: Head atraumatic, normocephalic. Oropharynx and nasopharynx clear. MP 1, TM distance >3 cm, normal mouth opening. LUNGS: No use of accessory muscles of respiration.   EXTREMITIES: No pedal edema, cyanosis, or clubbing.  NEUROLOGIC: normal gait PSYCHIATRIC: The patient is alert and oriented x 3.  SKIN: No obvious rash, lesion, or ulcer.    IMPRESSION AND PLAN:   Astraea Gaughran  is a 42  y.o. female presenting with obesity during pregnancy. BMI is currently 54 at [redacted] weeks gestation.   We discussed analgesic options during labor including epidural analgesia. Discussed that in obesity there can be increased difficulty with epidural placement or even failure of successful epidural. We also discussed that even after successful epidural placement there is increased risk of catheter migration out of the epidural space that would require  catheter replacement. Discussed use of epidural vs spinal vs GA if cesarean delivery is required. Discussed increased risk of difficult intubation during pregnancy should an emergency cesarean delivery be required.   This patient is appropriate for anesthetic care at [facility] in [place].  We discussed the limitations of a community hospital including but not limited to staffing, imaging, medications, and blood products.  The patient is aware of these limitations.  All questions answered and concerns addressed.

## 2023-10-02 ENCOUNTER — Ambulatory Visit: Payer: Managed Care, Other (non HMO) | Attending: Obstetrics and Gynecology

## 2023-10-02 ENCOUNTER — Other Ambulatory Visit: Payer: Self-pay

## 2023-10-02 DIAGNOSIS — O09293 Supervision of pregnancy with other poor reproductive or obstetric history, third trimester: Secondary | ICD-10-CM | POA: Diagnosis not present

## 2023-10-02 DIAGNOSIS — O99213 Obesity complicating pregnancy, third trimester: Secondary | ICD-10-CM | POA: Insufficient documentation

## 2023-10-02 DIAGNOSIS — Z6791 Unspecified blood type, Rh negative: Secondary | ICD-10-CM | POA: Insufficient documentation

## 2023-10-02 DIAGNOSIS — O09523 Supervision of elderly multigravida, third trimester: Secondary | ICD-10-CM | POA: Insufficient documentation

## 2023-10-02 DIAGNOSIS — E1169 Type 2 diabetes mellitus with other specified complication: Secondary | ICD-10-CM | POA: Diagnosis not present

## 2023-10-02 DIAGNOSIS — O34219 Maternal care for unspecified type scar from previous cesarean delivery: Secondary | ICD-10-CM | POA: Diagnosis present

## 2023-10-02 DIAGNOSIS — Z362 Encounter for other antenatal screening follow-up: Secondary | ICD-10-CM | POA: Insufficient documentation

## 2023-10-02 DIAGNOSIS — O24113 Pre-existing diabetes mellitus, type 2, in pregnancy, third trimester: Secondary | ICD-10-CM | POA: Insufficient documentation

## 2023-10-02 DIAGNOSIS — Z3A35 35 weeks gestation of pregnancy: Secondary | ICD-10-CM | POA: Diagnosis not present

## 2023-10-02 DIAGNOSIS — E039 Hypothyroidism, unspecified: Secondary | ICD-10-CM | POA: Diagnosis present

## 2023-10-02 DIAGNOSIS — O99283 Endocrine, nutritional and metabolic diseases complicating pregnancy, third trimester: Secondary | ICD-10-CM | POA: Insufficient documentation

## 2023-10-02 DIAGNOSIS — O26893 Other specified pregnancy related conditions, third trimester: Secondary | ICD-10-CM | POA: Insufficient documentation

## 2023-10-02 DIAGNOSIS — O360130 Maternal care for anti-D [Rh] antibodies, third trimester, not applicable or unspecified: Secondary | ICD-10-CM | POA: Diagnosis not present

## 2023-10-02 DIAGNOSIS — E669 Obesity, unspecified: Secondary | ICD-10-CM

## 2023-10-02 DIAGNOSIS — O4443 Low lying placenta NOS or without hemorrhage, third trimester: Secondary | ICD-10-CM

## 2023-10-07 ENCOUNTER — Ambulatory Visit: Payer: Managed Care, Other (non HMO) | Attending: Obstetrics and Gynecology

## 2023-10-07 DIAGNOSIS — O26899 Other specified pregnancy related conditions, unspecified trimester: Secondary | ICD-10-CM

## 2023-10-07 DIAGNOSIS — O99213 Obesity complicating pregnancy, third trimester: Secondary | ICD-10-CM | POA: Diagnosis not present

## 2023-10-07 DIAGNOSIS — Z3A36 36 weeks gestation of pregnancy: Secondary | ICD-10-CM | POA: Insufficient documentation

## 2023-10-07 DIAGNOSIS — O09513 Supervision of elderly primigravida, third trimester: Secondary | ICD-10-CM | POA: Diagnosis present

## 2023-10-07 DIAGNOSIS — O09293 Supervision of pregnancy with other poor reproductive or obstetric history, third trimester: Secondary | ICD-10-CM | POA: Diagnosis present

## 2023-10-07 DIAGNOSIS — O99283 Endocrine, nutritional and metabolic diseases complicating pregnancy, third trimester: Secondary | ICD-10-CM | POA: Insufficient documentation

## 2023-10-07 DIAGNOSIS — E669 Obesity, unspecified: Secondary | ICD-10-CM

## 2023-10-07 DIAGNOSIS — E1169 Type 2 diabetes mellitus with other specified complication: Secondary | ICD-10-CM

## 2023-10-07 DIAGNOSIS — Z362 Encounter for other antenatal screening follow-up: Secondary | ICD-10-CM | POA: Insufficient documentation

## 2023-10-07 DIAGNOSIS — O99214 Obesity complicating childbirth: Secondary | ICD-10-CM | POA: Diagnosis not present

## 2023-10-07 DIAGNOSIS — O4443 Low lying placenta NOS or without hemorrhage, third trimester: Secondary | ICD-10-CM

## 2023-10-07 DIAGNOSIS — Z794 Long term (current) use of insulin: Secondary | ICD-10-CM | POA: Insufficient documentation

## 2023-10-07 DIAGNOSIS — O09523 Supervision of elderly multigravida, third trimester: Secondary | ICD-10-CM

## 2023-10-07 DIAGNOSIS — O24113 Pre-existing diabetes mellitus, type 2, in pregnancy, third trimester: Secondary | ICD-10-CM | POA: Diagnosis present

## 2023-10-07 DIAGNOSIS — O34219 Maternal care for unspecified type scar from previous cesarean delivery: Secondary | ICD-10-CM

## 2023-10-07 DIAGNOSIS — E039 Hypothyroidism, unspecified: Secondary | ICD-10-CM | POA: Insufficient documentation

## 2023-10-16 ENCOUNTER — Encounter
Admission: RE | Admit: 2023-10-16 | Discharge: 2023-10-16 | Disposition: A | Discharge status: Home / Self Care | Payer: Managed Care, Other (non HMO) | Source: Ambulatory Visit | Attending: Obstetrics and Gynecology | Admitting: Obstetrics and Gynecology

## 2023-10-16 ENCOUNTER — Other Ambulatory Visit: Payer: Self-pay

## 2023-10-16 ENCOUNTER — Ambulatory Visit (HOSPITAL_BASED_OUTPATIENT_CLINIC_OR_DEPARTMENT_OTHER): Payer: Managed Care, Other (non HMO)

## 2023-10-16 DIAGNOSIS — O34219 Maternal care for unspecified type scar from previous cesarean delivery: Secondary | ICD-10-CM

## 2023-10-16 DIAGNOSIS — O99213 Obesity complicating pregnancy, third trimester: Secondary | ICD-10-CM | POA: Insufficient documentation

## 2023-10-16 DIAGNOSIS — O09293 Supervision of pregnancy with other poor reproductive or obstetric history, third trimester: Secondary | ICD-10-CM | POA: Insufficient documentation

## 2023-10-16 DIAGNOSIS — O09523 Supervision of elderly multigravida, third trimester: Secondary | ICD-10-CM

## 2023-10-16 DIAGNOSIS — Z3A37 37 weeks gestation of pregnancy: Secondary | ICD-10-CM | POA: Diagnosis not present

## 2023-10-16 DIAGNOSIS — O360130 Maternal care for anti-D [Rh] antibodies, third trimester, not applicable or unspecified: Secondary | ICD-10-CM

## 2023-10-16 DIAGNOSIS — O99280 Endocrine, nutritional and metabolic diseases complicating pregnancy, unspecified trimester: Secondary | ICD-10-CM

## 2023-10-16 DIAGNOSIS — Z362 Encounter for other antenatal screening follow-up: Secondary | ICD-10-CM | POA: Insufficient documentation

## 2023-10-16 DIAGNOSIS — E1169 Type 2 diabetes mellitus with other specified complication: Secondary | ICD-10-CM

## 2023-10-16 DIAGNOSIS — O99283 Endocrine, nutritional and metabolic diseases complicating pregnancy, third trimester: Secondary | ICD-10-CM | POA: Insufficient documentation

## 2023-10-16 DIAGNOSIS — E039 Hypothyroidism, unspecified: Secondary | ICD-10-CM | POA: Insufficient documentation

## 2023-10-16 DIAGNOSIS — O24113 Pre-existing diabetes mellitus, type 2, in pregnancy, third trimester: Secondary | ICD-10-CM

## 2023-10-16 DIAGNOSIS — Z6791 Unspecified blood type, Rh negative: Secondary | ICD-10-CM

## 2023-10-16 DIAGNOSIS — O24415 Gestational diabetes mellitus in pregnancy, controlled by oral hypoglycemic drugs: Secondary | ICD-10-CM

## 2023-10-16 DIAGNOSIS — O09513 Supervision of elderly primigravida, third trimester: Secondary | ICD-10-CM | POA: Diagnosis not present

## 2023-10-16 DIAGNOSIS — E669 Obesity, unspecified: Secondary | ICD-10-CM

## 2023-10-16 DIAGNOSIS — Z01812 Encounter for preprocedural laboratory examination: Secondary | ICD-10-CM

## 2023-10-16 NOTE — Patient Instructions (Addendum)
Your procedure is scheduled on: 10/24/23 - Thursday  Arrival Time: Please call Labor and Delivery the day before your scheduled C-Section to find out your arrival time. [phone].  Arrival: If your arrival time is prior to 6:00 am, please enter through the Emergency Room Entrance and you will be directed to Labor and Delivery. If your arrival time is 6:00 am or later, please enter the Medical Mall and follow the greeter's instructions.  REMEMBER: Instructions that are not followed completely may result in serious medical risk, up to and including death; or upon the discretion of your surgeon and anesthesiologist your surgery may need to be rescheduled.  Do not eat food or drink any liquids after midnight the night before surgery.  No gum chewing or hard candies.  One week prior to surgery: Stop Anti-inflammatories (NSAIDS) such as Advil, Aleve, Ibuprofen, Motrin, Naproxen, Naprosyn and Aspirin based products such as Excedrin, Goody's Powder, BC Powder. You may take Tylenol if needed for pain up until the day of surgery.  Stop ANY OVER THE COUNTER supplements until after surgery.  insulin NPH Human (NOVOLIN N) inject only 1/2 of your prescribed Insulin on the night before your surgery, and hold your dose of prescribed insulin on the morning of surgery.  insulin regular (NOVOLIN R) inject only 1/2 of your prescribed Insulin on the night before your surgery, and hold your dose of prescribed insulin on the morning of surgery.   ON THE DAY OF SURGERY ONLY TAKE THESE MEDICATIONS WITH SIPS OF WATER:  levothyroxine (SYNTHROID )   No Smoking including e-cigarettes for 24 hours prior to surgery.  No chewable tobacco products for at least 6 hours prior to surgery.  No nicotine patches on the day of surgery.  Do not use any "recreational" drugs for at least a week prior to your surgery.  Please be advised that the combination of cocaine and anesthesia may have negative outcomes, up to and  including death. If you test positive for cocaine, your surgery will be cancelled.  On the morning of surgery brush your teeth with toothpaste and water, you may rinse your mouth with mouthwash if you wish. Do not swallow any toothpaste or mouthwash.  Use CHG wipes as directed on instruction sheet.  Do not wear jewelry, make-up, hairpins, clips or nail polish.  For welded (permanent) jewelry: bracelets, anklets, waist bands, etc.  Please have this removed prior to surgery.  If it is not removed, there is a chance that hospital personnel will need to cut it off on the day of surgery.  Do not wear lotions, powders, or perfumes.   Do not shave body hair from the neck down 48 hours before surgery.  Contact lenses, hearing aids and dentures may not be worn into surgery.  Do not bring valuables to the hospital. [facility] is not responsible for any missing/lost belongings or valuables.   Notify your doctor if there is any change in your medical condition (cold, fever, infection).  Wear comfortable clothing (specific to your surgery type) to the hospital.  After surgery, you can help prevent lung complications by doing breathing exercises.  Take deep breaths and cough every 1-2 hours. Your doctor may order a device called an Incentive Spirometer to help you take deep breaths. When coughing or sneezing, hold a pillow firmly against your incision with both hands. This is called "splinting." Doing this helps protect your incision. It also decreases belly discomfort.  Please call the Pre-admissions Testing Dept. at [phone] if  you have any questions about these instructions.  Surgery Visitation Policy:  Visitor Passes   All visitors, including children, need an identification sticker when visiting. These stickers must be worn where they can be seen.   Labor & Delivery  Laboring women may have one designated support person and two other visitors of any age visit. The support person  must remain the same. The visitors may switch with other visitors. Visitation is permitted 24 hours per day. The designated support person or a visitor over the age of 16 may sleep overnight in the patient's room. A doula registered with [facility] for labor and delivery support is not considered a visitor. Doulas not registered with [facility] are considered visitors.  Mother Baby Unit, OB Specialty and Gynecological Care  A designated support person and three visitors of any age may visit. The three visitors may switch out. The designated support person or a visitor age 42 or older may stay overnight in the room. During the postpartum period (up to 6 weeks), if the mother is the patient, she can have her newborn stay with her if there is another support person present who can be responsible for the baby.    Preparing the Skin Before Surgery     To help prevent the risk of infection at your surgical site, we are now providing you with rinse-free Sage 2% Chlorhexidine Gluconate (CHG) disposable wipes.  Chlorhexidine Gluconate (CHG) Soap  o An antiseptic cleaner that kills germs and bonds with the skin to continue killing germs even after washing  o Used for showering the night before surgery and morning of surgery  The night before surgery: Shower or bathe with warm water. Do not apply perfume, lotions, powders. Wait one hour after shower. Skin should be dry and cool. Open Sage wipe package - use 6 disposable cloths. Wipe body using one cloth for the right arm, one cloth for the left arm, one cloth for the right leg, one cloth for the left leg, one cloth for the chest/abdomen area, and one cloth for the back. Do not use on open wounds or sores. Do not use on face or genitals (private parts). If you are breast feeding, do not use on breasts. 5. Do not rinse, allow to dry. 6. Skin may feel "tacky" for several minutes. 7. Dress in clean clothes. 8. Place clean sheets on your bed  and do not sleep with pets.  REPEAT ABOVE ON THE MORNING OF SURGERY BEFORE ARRIVING TO THE HOSPITAL.

## 2023-10-21 ENCOUNTER — Encounter
Admission: RE | Admit: 2023-10-21 | Discharge: 2023-10-21 | Disposition: A | Discharge status: Home / Self Care | Payer: Managed Care, Other (non HMO) | Source: Ambulatory Visit | Attending: Obstetrics and Gynecology | Admitting: Obstetrics and Gynecology

## 2023-10-21 DIAGNOSIS — O34219 Maternal care for unspecified type scar from previous cesarean delivery: Secondary | ICD-10-CM | POA: Insufficient documentation

## 2023-10-21 DIAGNOSIS — Z01812 Encounter for preprocedural laboratory examination: Secondary | ICD-10-CM | POA: Insufficient documentation

## 2023-10-21 DIAGNOSIS — Z3A00 Weeks of gestation of pregnancy not specified: Secondary | ICD-10-CM | POA: Insufficient documentation

## 2023-10-21 LAB — CBC
HCT: 39.3 % (ref 36.0–46.0)
Hemoglobin: 13.5 g/dL (ref 12.0–15.0)
MCH: 30.7 pg (ref 26.0–34.0)
MCHC: 34.4 g/dL (ref 30.0–36.0)
MCV: 89.3 fL (ref 80.0–100.0)
Platelets: 160 10*3/uL (ref 150–400)
RBC: 4.4 MIL/uL (ref 3.87–5.11)
RDW: 14.7 % (ref 11.5–15.5)
WBC: 7.5 10*3/uL (ref 4.0–10.5)
nRBC: 0 % (ref 0.0–0.2)

## 2023-10-21 LAB — TYPE AND SCREEN
ABO/RH(D): O NEG
Antibody Screen: POSITIVE
Extend sample reason: UNDETERMINED

## 2023-10-21 NOTE — H&P (Signed)
OB History & Physical   History of Present Illness:  Chief Complaint: pre-operative visit for repeat cesarean section  HPI:  Sabrina Richards is a 42 y.o. [id number] female at [redacted]w[redacted]d dated by LMP consistent with 7 week ultrasound.  Her pregnancy has been complicated by T2DM, obesity (BMI > 50), AMA, Rh negative, hypothyroid.    She denies contractions.   She denies leakage of fluid.   She denies vaginal bleeding.   She reports fetal movement.    Her T2DM has been well-controlled this pregnancy. She is taking NPH 30 units in AM and 12 units PM, Regular insulin 15 units in AM and 12 units PM.   Total weight gain for pregnancy: 1.119 kg (2 lb 7.5 oz)   Obstetrical Problem List: Pregnancy Problems (from 04/08/23 to present)     Problem Noted Diagnosed Resolved   Encounter for supervision of high risk pregnancy in third trimester, antepartum (HHS-HCC) 04/08/2023 by Ricci Barker, CMA  No   Overview Addendum 10/09/2023  4:26 PM by Ceasar Mons, CMA    42 y.o. [phone], at [redacted]w[redacted]d based on LMP of 01/23/22, with an Estimated Date of Delivery: 10/31/23  Sex of baby and name:  "Victoriano Lain "   Partner:    Weston Brass  Factors complicating this pregnancy  Diabetes Mellitus in Pregnancy Type 2 Medication: Insulin 04/11/23: NPH 30 units in AM and 12 units in PM. Reg 15 units in AM and 12 units in PM. Per [facility] - confirmed regimen 08/28/23  Fetal echocardiogram - completed and normal Referral to MFM sent 03/18/23   Anat scan Completed with MFM on 06/06/23 Recommendations:  -Fetal growth assessments every 4 weeks.  -Weekly BPP from [redacted] weeks gestation till delivery at MFM  -Delivery at [redacted] weeks gestation provided diabetes is well-   controlled.  Early term delivery (37 or 38 weeks) is indicated if   diabetes is not well-controlled.  Obtain baseline labs:  HgbA1C:  6.1                   Creatinine: 84                  24hr urine protein:                   TSH:1.420 EKG:  ASA - discussed Begin EFW and AFI between  28-32 weeks ( this will be done at MFM) Twice weekly NST with AFI beginning at 32 weeks  ( Once weekly AFIs at [facility], NST once weekly with MFM and Once weekly at [facility]) - appears BPPs being done at MFM, per note and appts scheduled (10/14)  Low Lying Placenta - RESOLVEDOn anat scan from MFM  06/07/23: Posterior, low lying 1.6cm from int os.  resolved 08/19/23 Hypothyroidism Current Medication: Synthroid QD 02/25/23 TSH: 1.65 05/15/23 TSH: 1.490 08/12/23: TSH 1.360  Previous C/S x2 C/s done for: failed IOL Records available in [facility] Delivery preference: Repeat c/s with SDJ: 10/24/23  Maternal Age >35 yo Age at delivery: 42yo Genetic Screening: Antenatal testing per T2DM  BMI 40 or more - Obesity Class III  BMI at NOB 53.75 Antenatal testing per T2DM Needs 3rd trimester anesthesia consult [09/25/23 [facility] ]   6. RH Negative Rhogam at 28w - given 08/06/23 Antibody screen: negative  Screening results and needs: NOB:  Medicaid Questionnaire: n/a0 []  ACHD Program Depression Score: 1 MBT: O Neg  Ab screen: Neg  HIV:  Neg RPR: NR  Hep B: Neg Hep C: NR  Pap: 04/11/23 NILM HPV- G/C: neg/neg Rubella: non-immune   VZV: immune TSH: 1.420 HgA1C: 6.1 Aneuploidy:  First trimester:  MaternitT21: negative Second trimester (AFP/tetra): negative  28 weeks:  Review Medicaid Questionnaire:n/a []  ACHD Program Depression Score:4 Blood consent:signed  Hgb: 13.4  Platelets: 168   Rhogam: given 08/06/23 36 weeks:  GBS:pos   G/C: neg  Hgb:13.3  Platelets:169    ZOX:WRUEAVW RPR:NR    Last [place]:  03/18/23:Uterus anteverted, Single, viable  IUP, S=[redacted]w[redacted]d, Yolk sac and amnion seen, FHR=146bpm, Cervical length=4.99cm, Rt ovary appears wnl, Lt corpus luteum=2.1cm, No free fluid seen, Multiple fibroids seen:largest= posterior 2.8cm 06/06/23: MFM Sinlge live IUP FHT=158 BPM Pres=Breech Plac+ Post, Low-lying, 1.6 cm from internal os. AFI=within normal limits. EFW=321 Gm (0lbs11oz) = 92% Cervix  length=3.4cm. Fetal Anatomical survey not complete. Will return in 5 weeks with MFM for fetal growth Assessment.  08/07/23:  Cardiac Activity Observed, Presentation:Cephalic Placenta:Posterior P. Cord Insertion:Previously seen, Amniotic Fluid, AFI  16.09   UJ:WJXBJY normal limits  09/11/23: MFM: presentation cephalic, AFI 10.54cm, BPP 8/8, Placenta posterior, FHR 131bpm 09/18/23: MFM: presentation cephalic, Placenta posterior, FHR 140bpm, AFI normal, BPP 8/8, [redacted]w[redacted]d 09/25/23: MFM: presentation cephalic, placenta posterior, FHR 130bpm, AFI 13.82cm, BPP 8/8, [redacted]w[redacted]d 10/02/23: MFM: presentation cephalic, placenta posterior, FHR 155bpm, AFI 10.7cm, BPP 8/8, [redacted]w[redacted]d 10/07/23: MFM: presentation cephalic, placenta posterior, FHR 132bpm, AFI wnl, BPP 8/8, [redacted]w[redacted]d, EFW 3352g, 7lb6oz at 87% Immunization:   Flu in season -received 08/06/23  Tdap at 27-36 weeks -received 08/06/23 Covid-19 -  RSV at 32-36 weeks -received 09/18/2023 bl Contraception Plan:  Feeding Plan:  Labor Plans: repeat C/S            Maternal Medical History:   Past Medical History:  Diagnosis Date   Diabetes mellitus without complication (CMS/HHS-HCC) 02/27/23   Fibroid 12/2013   Fibroids 12/2013   History of gestational diabetes    Hyperlipidemia    history of this, improved with lifestyle   Thyroid disease     Past Surgical History:  Procedure Laterality Date   Arm injury surgery  Right 2009   complex, severe dog bite   CESAREAN SECTION  10/2014   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 Last Dose  ACCU-CHEK SOFTCLIX LANCETS lancets USE 1 EACH DAILY Yes Taking  aspirin 81 MG EC tablet Take 81 mg by mouth once daily Yes Taking  blood glucose diagnostic test strip 1 each (1 strip total) 4 (four) times daily Use as  instructed. Yes Taking  blood glucose meter kit as directed Yes Taking  blood-glucose sensor (FREESTYLE LIBRE 3 SENSOR) Use 1 each every 14 (fourteen) days Yes Taking  insulin NPH (HUMULIN N) injection (concentration 100 units/mL) Inject 30 units in AM and 12 units in PM. Patient taking differently: Inject 30 units in AM and 18 units in PM. Yes Taking  insulin REGULAR (HUMULIN R) injection (concentration 100 units/mL) Inject 15 units in AM and 12 units in PM. Yes Taking  insulin syringe-needle U-100 0.3 mL 31 gauge x 5/16" syringe Use as directed Yes Taking  lancing device with lancets kit Use 1 each once daily Use as instructed. Yes Taking  levothyroxine (SYNTHROID) 50 MCG tablet Take 1 tablet (50 mcg total) by mouth once daily Take on an empty stomach with a glass of water at least  30-60 minutes before breakfast. Yes Taking  PNV no.95/ferrous fum/folic ac (PRENATAL ORAL) Take by mouth Yes Taking    OB History  Gravida Para Term Preterm AB Living  4 2 2   1 2   SAB IAB Ectopic Molar Multiple Live Births  1         2    # Outcome Date GA Lbr Len/2nd Weight Sex Type Anes PTL Lv  4 Current           3 Term 03/22/16 [redacted]w[redacted]d  4.05 kg (8 lb 14.9 oz) M CS-LTranv Spinal  LIV  2 Term 11/02/14 [redacted]w[redacted]d  3.629 kg (8 lb) F CS-LTranv Gen N LIV     Complications: Gestational diabetes (HHS-HCC)  1 SAB             Prenatal care site: [facility]  Social History: She  reports that she has never smoked. She has never used smokeless tobacco. She reports current alcohol use. She reports that she does not use drugs.  Family History: family history includes Breast cancer in her paternal grandmother; Coronary Artery Disease (Blocked arteries around heart) in her father; Diabetes in her father; Diabetes type II in her father; Drug abuse in her sister; High blood pressure (Hypertension) in her father; No Known Problems in her daughter and mother; Prostate cancer in her father; Thyroid disease in her  father, sister, and sister.   Review of Systems  Constitutional: Negative.   HENT: Negative.    Eyes: Negative.   Respiratory: Negative.    Cardiovascular: Negative.   Gastrointestinal: Negative.   Genitourinary: Negative.   Musculoskeletal: Negative.   Skin: Negative.   Neurological: Negative.   Endo/Heme/Allergies: Negative.   Psychiatric/Behavioral: Negative.       Physical Exam:  BP 119/76   Pulse 78   Ht 152.4 cm (5')   Wt (!) 125.9 kg (277 lb 9.6 oz)   LMP 01/24/2023 (Exact Date)   BMI 54.22 kg/m   Physical Exam Constitutional:      General: She is not in acute distress.    Appearance: Normal appearance.  HENT:     Head: Normocephalic and atraumatic.  Eyes:     General: No scleral icterus.    Conjunctiva/sclera: Conjunctivae normal.  Cardiovascular:     Rate and Rhythm: Normal rate and regular rhythm.     Heart sounds: No murmur heard.    No friction rub. No gallop.  Pulmonary:     Effort: Pulmonary effort is normal. No respiratory distress.     Breath sounds: Normal breath sounds. No wheezing, rhonchi or rales.  Abdominal:     General: Bowel sounds are normal. There is no distension.     Palpations: Abdomen is soft. There is mass (gravid, NT).     Tenderness: There is no abdominal tenderness. There is no guarding or rebound.  Musculoskeletal:        General: No swelling. Normal range of motion.  Neurological:     General: No focal deficit present.     Mental Status: She is oriented to person, place, and time.     Cranial Nerves: No cranial nerve deficit.  Skin:    General: Skin is warm and dry.     Findings: No lesion.  Psychiatric:        Mood and Affect: Mood normal.        Behavior: Behavior normal.        Judgment: Judgment normal.  Vitals and nursing note reviewed.  Pertinent Results:  Prenatal Labs Blood type/Rh O negative  Antibody screen negative  Rubella Not immune  Varicella Immune    RPR RN  HBsAg negative  HIV negative   HepC negative  GC negative  Chlamydia negative  Genetic screening NIPS low risk  1 hour GTT N/a  3 hour GTT N/a   GBS positive at 36 weeks  Flu vaccine: received: 08/06/2023 TDaP: received 08/06/2023 RSV: received 09/18/2023   Assessment:  Sabrina Richards is a 42 y.o. [id number] female at [redacted]w[redacted]d with history of c-section, AMA, obesity, Rh negative, rubella non-immune.   Plan:  Admit to Labor & Delivery  CBC, T&S, NPO, IVF GBS positive.   Consents signed today.  Plan to proceed with Repeat c-section as scheduled.   Rubye Oaks, MD 10/21/2023 11:54 AM

## 2023-10-22 LAB — RPR: RPR Ser Ql: NONREACTIVE

## 2023-10-23 MED ORDER — CHLORHEXIDINE GLUCONATE 0.12 % MT SOLN
15.0000 mL | Freq: Once | OROMUCOSAL | Status: AC
  Administered 2023-10-24: 15 mL via OROMUCOSAL
  Filled 2023-10-23: qty 15

## 2023-10-23 MED ORDER — CEFAZOLIN IN SODIUM CHLORIDE 3-0.9 GM/100ML-% IV SOLN: 3.0000 g | INTRAVENOUS | Status: DC

## 2023-10-23 MED ORDER — ORAL CARE MOUTH RINSE: 15.0000 mL | Freq: Once | OROMUCOSAL | Status: AC

## 2023-10-23 MED ORDER — SODIUM CHLORIDE 0.9 % IV SOLN
500.0000 mg | INTRAVENOUS | Status: DC
  Filled 2023-10-23: qty 5

## 2023-10-23 MED ORDER — LACTATED RINGERS IV SOLN: Freq: Once | INTRAVENOUS | Status: AC

## 2023-10-23 MED ORDER — SOD CITRATE-CITRIC ACID 500-334 MG/5ML PO SOLN
30.0000 mL | ORAL | Status: AC
  Administered 2023-10-24: 30 mL via ORAL

## 2023-10-23 NOTE — Anesthesia Preprocedure Evaluation (Signed)
Anesthesia Evaluation  Patient identified by MRN, date of birth, ID band Patient awake    Reviewed: Allergy & Precautions, NPO status , Patient's Chart, lab work & pertinent test results  History of Anesthesia Complications (+) PONV and history of anesthetic complications  Airway Mallampati: II   Neck ROM: Full    Dental no notable dental hx.    Pulmonary neg pulmonary ROS   Pulmonary exam normal breath sounds clear to auscultation       Cardiovascular Exercise Tolerance: Good negative cardio ROS Normal cardiovascular exam Rhythm:Regular Rate:Normal     Neuro/Psych negative neurological ROS     GI/Hepatic negative GI ROS,,,  Endo/Other  diabetes, Type 2Hypothyroidism  Class 4 obesity  Renal/GU negative Renal ROS     Musculoskeletal   Abdominal   Peds  Hematology negative hematology ROS (+)   Anesthesia Other Findings 42 yo [id number] at 78 0/7 presenting for repeat c-section.  Reproductive/Obstetrics                             Anesthesia Physical Anesthesia Plan  ASA: 3  Anesthesia Plan: Spinal   Post-op Pain Management:    Induction:   PONV Risk Score and Plan: 3 and Ondansetron, Dexamethasone and Treatment may vary due to age or medical condition  Airway Management Planned: Natural Airway and Nasal Cannula  Additional Equipment:   Intra-op Plan:   Post-operative Plan:   Informed Consent: I have reviewed the patients History and Physical, chart, labs and discussed the procedure including the risks, benefits and alternatives for the proposed anesthesia with the patient or authorized representative who has indicated his/her understanding and acceptance.     Dental Advisory Given  Plan Discussed with: Anesthesiologist, CRNA and Surgeon  Anesthesia Plan Comments: (Patient reports no bleeding problems and no anticoagulant use.  Plan for spinal with backup GA.  Patient  consented for risks of anesthesia including but not limited to:  - adverse reactions to medications - damage to eyes, teeth, lips or other oral mucosa - nerve damage due to positioning  - risk of bleeding, infection and or nerve damage from spinal that could lead to paralysis - risk of headache or failed spinal - damage to teeth, lips or other oral mucosa - sore throat or hoarseness - damage to heart, brain, nerves, lungs, other parts of body or loss of life  Patient voiced understanding and assent.)       Anesthesia Quick Evaluation

## 2023-10-24 ENCOUNTER — Other Ambulatory Visit: Payer: Self-pay

## 2023-10-24 ENCOUNTER — Encounter
Admission: RE | Disposition: A | Discharge status: Home / Self Care | Payer: Self-pay | Source: Ambulatory Visit | Attending: Obstetrics and Gynecology

## 2023-10-24 ENCOUNTER — Inpatient Hospital Stay: Payer: Managed Care, Other (non HMO) | Admitting: Urgent Care

## 2023-10-24 ENCOUNTER — Inpatient Hospital Stay: Payer: Self-pay | Admitting: Urgent Care

## 2023-10-24 ENCOUNTER — Inpatient Hospital Stay
Admission: RE | Admit: 2023-10-24 | Discharge: 2023-10-26 | DRG: 786 | Disposition: A | Discharge status: Home / Self Care | Payer: Managed Care, Other (non HMO) | Source: Ambulatory Visit | Attending: Obstetrics and Gynecology | Admitting: Obstetrics and Gynecology

## 2023-10-24 ENCOUNTER — Encounter: Payer: Self-pay | Admitting: Obstetrics and Gynecology

## 2023-10-24 DIAGNOSIS — O99210 Obesity complicating pregnancy, unspecified trimester: Secondary | ICD-10-CM | POA: Diagnosis present

## 2023-10-24 DIAGNOSIS — O26899 Other specified pregnancy related conditions, unspecified trimester: Secondary | ICD-10-CM

## 2023-10-24 DIAGNOSIS — E66813 Obesity, class 3: Secondary | ICD-10-CM | POA: Diagnosis present

## 2023-10-24 DIAGNOSIS — E119 Type 2 diabetes mellitus without complications: Secondary | ICD-10-CM | POA: Diagnosis present

## 2023-10-24 DIAGNOSIS — Z6791 Unspecified blood type, Rh negative: Secondary | ICD-10-CM | POA: Diagnosis not present

## 2023-10-24 DIAGNOSIS — Z3A38 38 weeks gestation of pregnancy: Secondary | ICD-10-CM | POA: Diagnosis not present

## 2023-10-24 DIAGNOSIS — E039 Hypothyroidism, unspecified: Secondary | ICD-10-CM | POA: Diagnosis present

## 2023-10-24 DIAGNOSIS — Z7982 Long term (current) use of aspirin: Secondary | ICD-10-CM | POA: Diagnosis not present

## 2023-10-24 DIAGNOSIS — O26893 Other specified pregnancy related conditions, third trimester: Secondary | ICD-10-CM | POA: Diagnosis present

## 2023-10-24 DIAGNOSIS — O2412 Pre-existing diabetes mellitus, type 2, in childbirth: Secondary | ICD-10-CM | POA: Diagnosis present

## 2023-10-24 DIAGNOSIS — O99214 Obesity complicating childbirth: Secondary | ICD-10-CM | POA: Diagnosis present

## 2023-10-24 DIAGNOSIS — Z79890 Hormone replacement therapy (postmenopausal): Secondary | ICD-10-CM | POA: Diagnosis not present

## 2023-10-24 DIAGNOSIS — Z794 Long term (current) use of insulin: Secondary | ICD-10-CM | POA: Diagnosis not present

## 2023-10-24 DIAGNOSIS — Z3A39 39 weeks gestation of pregnancy: Secondary | ICD-10-CM

## 2023-10-24 DIAGNOSIS — O99284 Endocrine, nutritional and metabolic diseases complicating childbirth: Secondary | ICD-10-CM | POA: Diagnosis present

## 2023-10-24 DIAGNOSIS — Z833 Family history of diabetes mellitus: Secondary | ICD-10-CM | POA: Diagnosis not present

## 2023-10-24 DIAGNOSIS — O99824 Streptococcus B carrier state complicating childbirth: Secondary | ICD-10-CM | POA: Diagnosis present

## 2023-10-24 DIAGNOSIS — O34219 Maternal care for unspecified type scar from previous cesarean delivery: Principal | ICD-10-CM

## 2023-10-24 DIAGNOSIS — Z8249 Family history of ischemic heart disease and other diseases of the circulatory system: Secondary | ICD-10-CM

## 2023-10-24 DIAGNOSIS — Z98891 History of uterine scar from previous surgery: Secondary | ICD-10-CM

## 2023-10-24 DIAGNOSIS — O34211 Maternal care for low transverse scar from previous cesarean delivery: Secondary | ICD-10-CM | POA: Diagnosis present

## 2023-10-24 DIAGNOSIS — Z23 Encounter for immunization: Secondary | ICD-10-CM | POA: Diagnosis not present

## 2023-10-24 DIAGNOSIS — O24414 Gestational diabetes mellitus in pregnancy, insulin controlled: Secondary | ICD-10-CM

## 2023-10-24 DIAGNOSIS — O24415 Gestational diabetes mellitus in pregnancy, controlled by oral hypoglycemic drugs: Secondary | ICD-10-CM

## 2023-10-24 DIAGNOSIS — O09529 Supervision of elderly multigravida, unspecified trimester: Secondary | ICD-10-CM

## 2023-10-24 LAB — CBC WITH DIFFERENTIAL/PLATELET
Abs Immature Granulocytes: 0.06 10*3/uL (ref 0.00–0.07)
Basophils Absolute: 0 10*3/uL (ref 0.0–0.1)
Basophils Relative: 1 %
Eosinophils Absolute: 0.1 10*3/uL (ref 0.0–0.5)
Eosinophils Relative: 1 %
HCT: 38 % (ref 36.0–46.0)
Hemoglobin: 13.4 g/dL (ref 12.0–15.0)
Immature Granulocytes: 1 %
Lymphocytes Relative: 28 %
Lymphs Abs: 1.7 10*3/uL (ref 0.7–4.0)
MCH: 31.3 pg (ref 26.0–34.0)
MCHC: 35.3 g/dL (ref 30.0–36.0)
MCV: 88.8 fL (ref 80.0–100.0)
Monocytes Absolute: 0.6 10*3/uL (ref 0.1–1.0)
Monocytes Relative: 9 %
Neutro Abs: 3.8 10*3/uL (ref 1.7–7.7)
Neutrophils Relative %: 60 %
Platelets: 153 10*3/uL (ref 150–400)
RBC: 4.28 MIL/uL (ref 3.87–5.11)
RDW: 14.6 % (ref 11.5–15.5)
WBC: 6.2 10*3/uL (ref 4.0–10.5)
nRBC: 0 % (ref 0.0–0.2)

## 2023-10-24 LAB — TYPE AND SCREEN
ABO/RH(D): O NEG
Antibody Screen: POSITIVE

## 2023-10-24 LAB — GLUCOSE, CAPILLARY
Glucose-Capillary: 81 mg/dL (ref 70–99)
Glucose-Capillary: 79 mg/dL (ref 70–99)
Glucose-Capillary: 128 mg/dL — ABNORMAL HIGH (ref 70–99)
Glucose-Capillary: 169 mg/dL — ABNORMAL HIGH (ref 70–99)
Glucose-Capillary: 138 mg/dL — ABNORMAL HIGH (ref 70–99)

## 2023-10-24 LAB — RPR: RPR Ser Ql: NONREACTIVE

## 2023-10-24 SURGERY — Surgical Case
Anesthesia: Spinal

## 2023-10-24 MED ORDER — MENTHOL 3 MG MT LOZG: 1.0000 | LOZENGE | OROMUCOSAL | Status: DC | PRN

## 2023-10-24 MED ORDER — SENNOSIDES-DOCUSATE SODIUM 8.6-50 MG PO TABS
2.0000 | ORAL_TABLET | ORAL | Status: DC
Start: 2023-10-24 — End: 2023-10-26
  Administered 2023-10-24 – 2023-10-26 (×3): 2 via ORAL
  Filled 2023-10-24 (×3): qty 2

## 2023-10-24 MED ORDER — SIMETHICONE 80 MG PO CHEW
80.0000 mg | CHEWABLE_TABLET | Freq: Three times a day (TID) | ORAL | Status: DC
Start: 2023-10-24 — End: 2023-10-26
  Administered 2023-10-24 – 2023-10-26 (×6): 80 mg via ORAL
  Filled 2023-10-24 (×6): qty 1

## 2023-10-24 MED ORDER — WITCH HAZEL-GLYCERIN EX PADS
1.0000 | MEDICATED_PAD | CUTANEOUS | Status: DC | PRN
Start: 2023-10-24 — End: 2023-10-26

## 2023-10-24 MED ORDER — HYDROCODONE-ACETAMINOPHEN 5-325 MG PO TABS
1.0000 | ORAL_TABLET | ORAL | Status: DC | PRN
  Administered 2023-10-25: 1 via ORAL
  Filled 2023-10-24 (×2): qty 1

## 2023-10-24 MED ORDER — PHENYLEPHRINE HCL-NACL 20-0.9 MG/250ML-% IV SOLN
INTRAVENOUS | Status: DC | PRN
  Administered 2023-10-24: 40 ug/min via INTRAVENOUS

## 2023-10-24 MED ORDER — LIDOCAINE HCL (PF) 1 % IJ SOLN
INTRAMUSCULAR | Status: DC | PRN
  Administered 2023-10-24: 3 mL

## 2023-10-24 MED ORDER — KETOROLAC TROMETHAMINE 30 MG/ML IJ SOLN
INTRAMUSCULAR | Status: DC | PRN
  Administered 2023-10-24: 30 mg via INTRAVENOUS

## 2023-10-24 MED ORDER — SCOPOLAMINE 1 MG/3DAYS TD PT72
1.0000 | MEDICATED_PATCH | Freq: Once | TRANSDERMAL | Status: DC
  Administered 2023-10-24: 1.5 mg via TRANSDERMAL
  Filled 2023-10-24: qty 1

## 2023-10-24 MED ORDER — DIPHENHYDRAMINE HCL 25 MG PO CAPS
25.0000 mg | ORAL_CAPSULE | Freq: Four times a day (QID) | ORAL | Status: DC | PRN
Start: 2023-10-24 — End: 2023-10-26

## 2023-10-24 MED ORDER — MORPHINE SULFATE (PF) 0.5 MG/ML IJ SOLN
INTRAMUSCULAR | Status: DC | PRN
  Administered 2023-10-24: 50 ug via INTRATHECAL

## 2023-10-24 MED ORDER — OXYTOCIN-SODIUM CHLORIDE 30-0.9 UT/500ML-% IV SOLN
2.5000 [IU]/h | INTRAVENOUS | Status: AC
Start: 2023-10-24 — End: 2023-10-25
  Administered 2023-10-24: 2.5 [IU]/h via INTRAVENOUS

## 2023-10-24 MED ORDER — FERROUS SULFATE 325 (65 FE) MG PO TABS
325.0000 mg | ORAL_TABLET | Freq: Two times a day (BID) | ORAL | Status: DC
Start: 2023-10-24 — End: 2023-10-26
  Administered 2023-10-24 – 2023-10-26 (×4): 325 mg via ORAL
  Filled 2023-10-24 (×4): qty 1

## 2023-10-24 MED ORDER — DEXTROSE 5 % IV SOLN
3.0000 g | Freq: Once | INTRAVENOUS | Status: AC
  Administered 2023-10-24: 3 g via INTRAVENOUS
  Filled 2023-10-24: qty 3

## 2023-10-24 MED ORDER — NALOXONE HCL 4 MG/10ML IJ SOLN: 1.0000 ug/kg/h | INTRAVENOUS | Status: DC | PRN

## 2023-10-24 MED ORDER — PHENYLEPHRINE HCL-NACL 20-0.9 MG/250ML-% IV SOLN
INTRAVENOUS | Status: AC
  Filled 2023-10-24: qty 250

## 2023-10-24 MED ORDER — FENTANYL CITRATE (PF) 100 MCG/2ML IJ SOLN
INTRAMUSCULAR | Status: AC
  Filled 2023-10-24: qty 2

## 2023-10-24 MED ORDER — KETOROLAC TROMETHAMINE 30 MG/ML IJ SOLN: 30.0000 mg | Freq: Four times a day (QID) | INTRAMUSCULAR | Status: AC | PRN

## 2023-10-24 MED ORDER — OXYTOCIN-SODIUM CHLORIDE 30-0.9 UT/500ML-% IV SOLN
INTRAVENOUS | Status: DC | PRN
  Administered 2023-10-24: 500 mL via INTRAVENOUS

## 2023-10-24 MED ORDER — DIPHENHYDRAMINE HCL 25 MG PO CAPS: 25.0000 mg | ORAL_CAPSULE | ORAL | Status: DC | PRN

## 2023-10-24 MED ORDER — IBUPROFEN 600 MG PO TABS
600.0000 mg | ORAL_TABLET | Freq: Four times a day (QID) | ORAL | Status: DC
  Administered 2023-10-25 – 2023-10-26 (×4): 600 mg via ORAL
  Filled 2023-10-24 (×4): qty 1

## 2023-10-24 MED ORDER — NALOXONE HCL 0.4 MG/ML IJ SOLN: 0.4000 mg | INTRAMUSCULAR | Status: DC | PRN

## 2023-10-24 MED ORDER — SODIUM CHLORIDE 0.9 % IV SOLN: INTRAVENOUS | Status: DC | PRN

## 2023-10-24 MED ORDER — KETOROLAC TROMETHAMINE 30 MG/ML IJ SOLN
30.0000 mg | Freq: Four times a day (QID) | INTRAMUSCULAR | Status: AC | PRN
  Administered 2023-10-25: 30 mg via INTRAVENOUS
  Filled 2023-10-24: qty 1

## 2023-10-24 MED ORDER — ACETAMINOPHEN 500 MG PO TABS
1000.0000 mg | ORAL_TABLET | Freq: Four times a day (QID) | ORAL | Status: AC
  Administered 2023-10-24 – 2023-10-25 (×4): 1000 mg via ORAL
  Filled 2023-10-24 (×3): qty 2

## 2023-10-24 MED ORDER — MORPHINE SULFATE (PF) 0.5 MG/ML IJ SOLN: INTRAMUSCULAR | Status: DC | PRN

## 2023-10-24 MED ORDER — DIPHENHYDRAMINE HCL 50 MG/ML IJ SOLN: 12.5000 mg | INTRAMUSCULAR | Status: DC | PRN

## 2023-10-24 MED ORDER — HYDROCODONE-ACETAMINOPHEN 5-325 MG PO TABS: 2.0000 | ORAL_TABLET | ORAL | Status: DC | PRN

## 2023-10-24 MED ORDER — EPHEDRINE SULFATE-NACL 50-0.9 MG/10ML-% IV SOSY
PREFILLED_SYRINGE | INTRAVENOUS | Status: DC | PRN
  Administered 2023-10-24: 5 mg via INTRAVENOUS
  Administered 2023-10-24: 2.5 mg via INTRAVENOUS
  Administered 2023-10-24: 5 mg via INTRAVENOUS
  Administered 2023-10-24: 2.5 mg via INTRAVENOUS
  Administered 2023-10-24 (×2): 5 mg via INTRAVENOUS

## 2023-10-24 MED ORDER — PHENYLEPHRINE 80 MCG/ML (10ML) SYRINGE FOR IV PUSH (FOR BLOOD PRESSURE SUPPORT)
PREFILLED_SYRINGE | INTRAVENOUS | Status: DC | PRN
  Administered 2023-10-24: 80 ug via INTRAVENOUS
  Administered 2023-10-24 (×4): 160 ug via INTRAVENOUS

## 2023-10-24 MED ORDER — FENTANYL CITRATE (PF) 100 MCG/2ML IJ SOLN
INTRAMUSCULAR | Status: DC | PRN
  Administered 2023-10-24: 15 ug via INTRATHECAL

## 2023-10-24 MED ORDER — PRENATAL MULTIVITAMIN CH
1.0000 | ORAL_TABLET | Freq: Every day | ORAL | Status: DC
  Administered 2023-10-24 – 2023-10-26 (×3): 1 via ORAL
  Filled 2023-10-24 (×3): qty 1

## 2023-10-24 MED ORDER — DEXAMETHASONE SODIUM PHOSPHATE 10 MG/ML IJ SOLN
INTRAMUSCULAR | Status: DC | PRN
  Administered 2023-10-24: 10 mg via INTRAVENOUS

## 2023-10-24 MED ORDER — MORPHINE SULFATE (PF) 0.5 MG/ML IJ SOLN
INTRAMUSCULAR | Status: AC
  Filled 2023-10-24: qty 10

## 2023-10-24 MED ORDER — MEPERIDINE HCL 25 MG/ML IJ SOLN: 6.2500 mg | INTRAMUSCULAR | Status: DC | PRN

## 2023-10-24 MED ORDER — ACETAMINOPHEN 500 MG PO TABS
ORAL_TABLET | ORAL | Status: AC
  Filled 2023-10-24: qty 2

## 2023-10-24 MED ORDER — SOD CITRATE-CITRIC ACID 500-334 MG/5ML PO SOLN
ORAL | Status: AC
  Filled 2023-10-24: qty 15

## 2023-10-24 MED ORDER — MEASLES, MUMPS & RUBELLA VAC IJ SOLR
0.5000 mL | Freq: Once | INTRAMUSCULAR | Status: AC
  Administered 2023-10-26: 0.5 mL via SUBCUTANEOUS
  Filled 2023-10-24 (×2): qty 0.5

## 2023-10-24 MED ORDER — SODIUM CHLORIDE 0.9% FLUSH: 3.0000 mL | INTRAVENOUS | Status: DC | PRN

## 2023-10-24 MED ORDER — BUPIVACAINE HCL (PF) 0.5 % IJ SOLN
INTRAMUSCULAR | Status: AC
  Filled 2023-10-24: qty 10

## 2023-10-24 MED ORDER — OXYTOCIN-SODIUM CHLORIDE 30-0.9 UT/500ML-% IV SOLN
INTRAVENOUS | Status: AC
  Filled 2023-10-24: qty 500

## 2023-10-24 MED ORDER — GLYCOPYRROLATE 0.2 MG/ML IJ SOLN
INTRAMUSCULAR | Status: DC | PRN
Start: 2023-10-24 — End: 2023-10-24
  Administered 2023-10-24: .2 mg via INTRAVENOUS

## 2023-10-24 MED ORDER — LACTATED RINGERS IV SOLN
INTRAVENOUS | Status: AC
  Administered 2023-10-24: 150 mL via INTRAVENOUS
  Administered 2023-10-24: 500 mL via INTRAVENOUS

## 2023-10-24 MED ORDER — ONDANSETRON HCL 4 MG/2ML IJ SOLN
INTRAMUSCULAR | Status: DC | PRN
  Administered 2023-10-24: 4 mg via INTRAVENOUS

## 2023-10-24 MED ORDER — ONDANSETRON HCL 4 MG/2ML IJ SOLN
4.0000 mg | Freq: Three times a day (TID) | INTRAMUSCULAR | Status: DC | PRN
  Administered 2023-10-24: 4 mg via INTRAVENOUS
  Filled 2023-10-24: qty 2

## 2023-10-24 MED ORDER — COCONUT OIL OIL
1.0000 | TOPICAL_OIL | Status: DC | PRN
Start: 2023-10-24 — End: 2023-10-26

## 2023-10-24 MED ORDER — ENOXAPARIN SODIUM 40 MG/0.4ML IJ SOSY
40.0000 mg | PREFILLED_SYRINGE | INTRAMUSCULAR | Status: DC
  Administered 2023-10-25 – 2023-10-26 (×2): 40 mg via SUBCUTANEOUS
  Filled 2023-10-24 (×2): qty 0.4

## 2023-10-24 MED ORDER — DIBUCAINE (PERIANAL) 1 % EX OINT
1.0000 | TOPICAL_OINTMENT | CUTANEOUS | Status: DC | PRN
Start: 2023-10-24 — End: 2023-10-26

## 2023-10-24 MED ORDER — LEVOTHYROXINE SODIUM 50 MCG PO TABS
50.0000 ug | ORAL_TABLET | Freq: Every day | ORAL | Status: DC
  Administered 2023-10-25 – 2023-10-26 (×2): 50 ug via ORAL
  Filled 2023-10-24 (×2): qty 1

## 2023-10-24 MED ORDER — BUPIVACAINE IN DEXTROSE 0.75-8.25 % IT SOLN
INTRATHECAL | Status: DC | PRN
  Administered 2023-10-24: 1.4 mL via INTRATHECAL

## 2023-10-24 SURGICAL SUPPLY — 36 items
CATH KIT ON-Q SILVERSOAK 5 (CATHETERS) ×2 IMPLANT
CATH KIT ON-Q SILVERSOAK 5IN (CATHETERS) ×2 IMPLANT
DERMABOND ADVANCED .7 DNX12 (GAUZE/BANDAGES/DRESSINGS) ×1 IMPLANT
DRESSING PEEL AND PLAC PRVNA20 (GAUZE/BANDAGES/DRESSINGS) IMPLANT
DRSG OPSITE POSTOP 4X10 (GAUZE/BANDAGES/DRESSINGS) ×1 IMPLANT
DRSG OPSITE POSTOP 4X12 (GAUZE/BANDAGES/DRESSINGS) IMPLANT
DRSG PEEL AND PLACE PREVENA 20 (GAUZE/BANDAGES/DRESSINGS) ×1
DRSG TELFA 3X8 NADH STRL (GAUZE/BANDAGES/DRESSINGS) ×1 IMPLANT
ELECT CAUTERY BLADE 6.4 (BLADE) ×1 IMPLANT
ELECT REM PT RETURN 9FT ADLT (ELECTROSURGICAL) ×1
ELECTRODE REM PT RTRN 9FT ADLT (ELECTROSURGICAL) ×1 IMPLANT
EXTRT SYSTEM ALEXIS 17CM (MISCELLANEOUS) ×1
GAUZE SPONGE 4X4 12PLY STRL (GAUZE/BANDAGES/DRESSINGS) ×1 IMPLANT
GLOVE BIO SURGEON STRL SZ7 (GLOVE) ×1 IMPLANT
GLOVE INDICATOR 7.5 STRL GRN (GLOVE) ×1 IMPLANT
GOWN STRL REUS W/ TWL LRG LVL3 (GOWN DISPOSABLE) ×3 IMPLANT
KIT PREVENA INCISION MGT20CM45 (CANNISTER) IMPLANT
MANIFOLD NEPTUNE II (INSTRUMENTS) ×1 IMPLANT
MAT PREVALON FULL STRYKER (MISCELLANEOUS) ×1 IMPLANT
NS IRRIG 1000ML POUR BTL (IV SOLUTION) ×1 IMPLANT
PACK C SECTION AR (MISCELLANEOUS) ×1 IMPLANT
PAD OB MATERNITY 4.3X12.25 (PERSONAL CARE ITEMS) ×2 IMPLANT
PAD PREP OB/GYN DISP 24X41 (PERSONAL CARE ITEMS) ×1 IMPLANT
RETAINER VISCERA MED (MISCELLANEOUS) IMPLANT
RETRACTOR TRAXI PANNICULUS (MISCELLANEOUS) IMPLANT
SCRUB CHG 4% DYNA-HEX 4OZ (MISCELLANEOUS) ×1 IMPLANT
STAPLER INSORB 30 2030 C-SECTI (MISCELLANEOUS) IMPLANT
STRIP CLOSURE SKIN 1/2X4 (GAUZE/BANDAGES/DRESSINGS) ×1 IMPLANT
SUT MNCRL 4-0 27XMFL (SUTURE) ×1
SUT PDS AB 1 TP1 96 (SUTURE) ×1 IMPLANT
SUT VIC AB 0 CTX36XBRD ANBCTRL (SUTURE) ×2 IMPLANT
SUTURE MNCRL 4-0 27XMF (SUTURE) ×1 IMPLANT
SWABSTK COMLB BENZOIN TINCTURE (MISCELLANEOUS) ×1 IMPLANT
SYSTEM CONTND EXTRCTN KII BLLN (MISCELLANEOUS) IMPLANT
TRAP FLUID SMOKE EVACUATOR (MISCELLANEOUS) ×1 IMPLANT
WATER STERILE IRR 500ML POUR (IV SOLUTION) ×1 IMPLANT

## 2023-10-24 NOTE — Interval H&P Note (Signed)
History and Physical Interval Note:  10/24/2023 7:19 AM  Sabrina Richards  has presented today for surgery, with the diagnosis of prior cesarean.  The various methods of treatment have been discussed with the patient and family. After consideration of risks, benefits and other options for treatment, the patient has consented to  Procedure(s): REPEAT CESAREAN SECTION (N/A) as a surgical intervention.  The patient's history has been reviewed, patient examined, no change in status, stable for surgery.  I have reviewed the patient's chart and labs.  Questions were answered to the patient's satisfaction.    Thomasene Mohair, MD, [facility] 10/24/2023 7:19 AM

## 2023-10-24 NOTE — Lactation Note (Signed)
This note was copied from a baby's chart. Lactation Consultation Note  Patient Name: Boy Reaver Raymer [id number] Date: 10/24/2023 Age:41 hours Reason for consult: Initial assessment;Term   Maternal Data This is mom's 3rd baby, repeat C/S. Mom with history of hypothyroidism,diabetes in pregnancy(requiring insulin), AMA obesity.  At first visit mom reports baby has breastfed several times, has had wet and 3 stool diapers since birth. Per mom She attempted to breastfeed again at around 5 pm and baby was too sleepy. Per parents baby is also a little "spitty". LC recommended waking baby. Dad unswaddled baby, sat baby up and burped the baby. Baby with spit up of some clear secretions. Dad used bulb syringe to assist baby. After spitting up baby would not root and is sleepy. Breastfeeding attempt stopped. Recommended parents  attempt feed in another 30 minutes and to call care nurse if baby will not latch and feed. Has patient been taught Hand Expression?: Yes Does the patient have breastfeeding experience prior to this delivery?: Yes How long did the patient breastfeed?: Mom breast each of her 2 previous children for 1 year. Her last baby is now 66 years old.  Feeding Mother's Current Feeding Choice: Breast Milk  Interventions Interventions: Breast feeding basics reviewed;Education Reviewed what to expect in first days when breastfeeding: normative sleepy behavior in first 24 hours, how to wake a sleep baby, offering baby to feed with cues and at least 8 times in 24 hours, how to know the baby is getting enough, cluster feeding.  Discharge Pump: Personal  Consult Status Consult Status: Follow-up Date: 10/25/23 Follow-up type: In-patient  Update provided to care nurse.   Fuller Song 10/24/2023, 7:42 PM

## 2023-10-24 NOTE — Anesthesia Procedure Notes (Signed)
Spinal  Patient location during procedure: OR Start time: 10/24/2023 7:57 AM End time: 10/24/2023 7:59 AM Reason for block: surgical anesthesia Staffing Performed: resident/CRNA  Anesthesiologist: Reed Breech, MD Resident/CRNA: Jeanine Luz, CRNA Performed by: Joanette Gula, Cameron Schwinn, CRNA Authorized by: Reed Breech, MD   Preanesthetic Checklist Completed: patient identified, IV checked, site marked, risks and benefits discussed, surgical consent, monitors and equipment checked and pre-op evaluation Spinal Block Patient position: sitting Prep: ChloraPrep Patient monitoring: heart rate, continuous pulse ox and blood pressure Approach: midline Location: L3-4 Injection technique: single-shot Needle Needle type: Pencan  Needle gauge: 24 G Needle length: 10 cm Assessment Sensory level: T3 Events: CSF return Additional Notes IV functioning, monitors applied to pt. Expiration date of kit checked and confirmed to be in date. Sterile prep and drape, hand hygiene and sterile gloved used. Pt was positioned and spine was prepped in sterile fashion. Skin was anesthetized with lidocaine. Free flow of clear CSF obtained prior to injecting local anesthetic into CSF x 1 attempt. Spinal needle aspirated freely following injection. Needle was carefully withdrawn, and pt tolerated procedure well. Loss of motor and sensory on exam post injection.

## 2023-10-24 NOTE — Op Note (Signed)
Cesarean Section Operative Note    Patient Name: Sabrina Richards  Date of Birth: 12/03/1980  MRN: [id number]  Date of Surgery: 10/24/2023   Pre-operative Diagnosis:  1) History of cesarean delivery, desires repeat 2) Severe obesity in pregnancy 3) Diabetes in pregnancy, requiring insulin 4) intrauterine pregnancy at 100w0d   Post-operative Diagnosis:  1) History of cesarean delivery, desires repeat 2) Severe obesity in pregnancy 3) Diabetes in pregnancy, requiring insulin 4) intrauterine pregnancy at [redacted]w[redacted]d    Procedure: Repeat Low Transverse Cesarean Delivery  Surgeon: Surgeons and Role:    * Conard Novak, MD - Primary  Assistants: Dr. Romana Juniper, MD; No other capable assistant available, in surgery requiring high level assistant.  Anesthesia: spinal   Findings:  1) normal appearing gravid uterus, bilateral fallopian tubes, and ovaries 2) viable female infant with weight of 3,690 grams, APGARs 9 and 9   Quantified Blood Loss: 765 mL  Total IV Fluids: 1,000 ml   Urine Output:  500 mL clear urine output  Specimens: none  Complications: no complications  Disposition: PACU - hemodynamically stable.   Maternal Condition: stable   Baby condition / location:  Couplet care / Skin to Skin  Procedure Details:  The patient was seen in the Holding Room. The risks, benefits, complications, treatment options, and expected outcomes were discussed with the patient. The patient concurred with the proposed plan, giving informed consent. identified as Sabrina Richards and the procedure verified as C-Section Delivery. A Time Out was held and the above information confirmed.   After induction of anesthesia, the patient was draped and prepped in the usual sterile manner. After the placement of a Traxxi panniculus retractor, a Pfannenstiel incision was made and carried down through the subcutaneous tissue to the fascia. Fascial incision was made and extended transversely. The  fascia was separated from the underlying rectus tissue superiorly and inferiorly. The peritoneum was identified and entered. Peritoneal incision was extended longitudinally. The bladder flap was not bluntly or sharply freed from the lower uterine segment. A low transverse uterine incision was made and the hysterotomy was extended with cranial-caudal tension. Delivered from cephalic presentation was a 3,690 gram Living newborn infant(s) or Female with Apgar scores of 9 at one minute and 9 at five minutes. Cord ph was not sent the umbilical cord was clamped and cut cord blood was obtained for evaluation. The placenta was removed Intact and appeared normal. The uterine outline, tubes and ovaries appeared normal. The uterus was initially exteriorized. However, this did not provide access to visualize the hysterotomy.  So, the uterus was returned to the abdomen for incision closure.  The uterine incision was closed with running locked sutures of 0 Vicryl.  A second layer of the same suture was thrown in an imbricating fashion.  Hemostasis was assured.  The rectus muscles were inspected and found to be hemostatic.  The fascia was then reapproximated with running sutures of 1-0 PDS, looped. The subcutaneous tissue was reapproximated using 2-0 plain gut such that no greater than 2cm of dead space remained. The subcuticular closure was performed using 4-0 monocryl. The skin closure was reinforced using surgical skin glue.  The 20 cm Proveena wound vac was applied in accordance with the manufacturer's recommendations and a seal was verified.    The surgical assistant performed tissue retraction (which was especially difficult in this case), assistance with suturing, fundal pressure, and placement of the wound vac.  Instrument, sponge, and needle counts were correct prior the  abdominal closure and were correct at the conclusion of the case.  The patient received Ancef 3 gram IV prior to skin incision (within 30 minutes).  For VTE prophylaxis she was wearing SCDs throughout the case.  The assistant surgeon was an MD due to lack of availability of another Sales promotion account executive.    Signed: Conard Novak, MD 10/24/2023 9:40 AM

## 2023-10-24 NOTE — Transfer of Care (Signed)
Immediate Anesthesia Transfer of Care Note  Patient: Sabrina Richards  Procedure(s) Performed: REPEAT CESAREAN SECTION  Patient Location: Mother/Baby  Anesthesia Type:Spinal  Level of Consciousness: awake, alert , and oriented  Airway & Oxygen Therapy: Patient Spontanous Breathing  Post-op Assessment: Report given to RN and Post -op Vital signs reviewed and stable  Post vital signs: Reviewed and stable  Last Vitals:  Vitals Value Taken Time  BP 110/63   Temp    Pulse 68   Resp 14   SpO2 92     Last Pain:  Vitals:   10/24/23 0712  TempSrc: Oral  PainSc: 0-No pain         Complications: No notable events documented.

## 2023-10-24 NOTE — Discharge Summary (Addendum)
Postpartum Discharge Summary  Patient Name: Sabrina Richards DOB: 02-03-1981 MRN: [id number]  Date of admission: 10/24/2023 Delivery date:10/24/2023 Delivering provider: Thomasene Mohair D Date of discharge: 10/26/2023  Primary OB: [facility] [id number] last menstrual period was 01/24/2023. EDC Estimated Date of Delivery: 10/31/23 Gestational Age at Delivery: [redacted]w[redacted]d   Admitting diagnosis: History of cesarean delivery [Z98.891] Intrauterine pregnancy: [redacted]w[redacted]d     Secondary diagnosis:   Principal Problem:   History of cesarean delivery Active Problems:   Obesity complicating pregnancy   Rh negative, antepartum   Hypothyroid in pregnancy, antepartum   AMA (advanced maternal age) multigravida 35+   [redacted] weeks gestation of pregnancy   Discharge Diagnosis: Term Pregnancy Delivered and Type 2 DM      Hospital course: Sceduled C/S   42 y.o. yo [phone] at [redacted]w[redacted]d was admitted to the hospital 10/24/2023 for scheduled cesarean section with the following indication:Elective Repeat.Delivery details are as follows:  Membrane Rupture Time/Date: 8:39 AM,10/24/2023  Delivery Method:C-Section, Low Transverse Operative Delivery:N/A Details of operation can be found in separate operative note.  Patient had an uncomplicated postpartum course.  She is ambulating, tolerating a regular diet, passing flatus, and urinating well.  Patient was on NPH insulin during pregnancy., then started on Metformin postpartum. Patient will take 500 mg daily x7 days then will increase to 500 mg BID if tolerated Patient to f/u with PCP in 1 month. Last A1c 6.1.Patient is discharged home in stable condition on  10/26/23         Newborn Data: Birth date:10/24/2023 Birth time:8:40 AM Gender:Female Living status:Living Apgars:9 ,9  Weight:3690 g                                              Post partum procedures: none Complications: None Delivery Type: repeat cesarean section, low transverse incision Anesthesia: spinal  anesthesia Placenta: spontaneous To Pathology: No   Prenatal Labs:  Blood type/Rh O negative  Antibody screen negative  Rubella Not immune  Varicella Immune    RPR RN  HBsAg negative  HIV negative  HepC negative  GC negative  Chlamydia negative  Genetic screening NIPS low risk  1 hour GTT N/a  3 hour GTT N/a   GBS positive at 36 weeks  Flu vaccine: received: 08/06/2023 TDaP: received 08/06/2023 RSV: received 09/18/2023  Magnesium Sulfate received: No BMZ received: No Rhophylac:was given MMR: was given Varivax vaccine given: was not indicated T-DaP:Given prenatally Flu: Given prenatally RSV vaccine: Given prenatal > 2 weeks prior to delivery  Transfusion:No  Physical exam  Vitals:   10/25/23 1551 10/25/23 2036 10/26/23 0046 10/26/23 0810  BP: 134/63 (!) 115/58 (!) 108/52 119/66  Pulse: 73 84 72 78  Resp: 18 18 18 20   Temp: 98.5 F (36.9 C) 98.9 F (37.2 C) 97.7 F (36.5 C) 98.6 F (37 C)  TempSrc: Oral Oral Oral Oral  SpO2:  98% 98% 99%  Weight:      Height:       General: alert, cooperative, and no distress Lochia: appropriate Uterine Fundus: firm Perineum:minimal edema/intact Incision: Healing well with no significant drainage, No significant erythema, Dressing is clean, dry, and intact, covered with occlusive OP site dressing  DVT Evaluation: No evidence of DVT seen on physical exam. Negative Homan's sign. No cords or calf tenderness. No significant calf/ankle edema.  Labs: Lab Results  Component Value Date  WBC 10.2 10/25/2023   HGB 10.7 (L) 10/25/2023   HCT 30.9 (L) 10/25/2023   MCV 91.2 10/25/2023   PLT 147 (L) 10/25/2023      Latest Ref Rng & Units 10/25/2023    6:12 AM  CMP  Creatinine 0.44 - 1.00 mg/dL 1.61    Edinburgh Score:    10/25/2023    1:00 PM  Edinburgh Postnatal Depression Scale Screening Tool  I have been able to laugh and see the funny side of things. 0  I have looked forward with enjoyment to things. 0  I have blamed  myself unnecessarily when things went wrong. 1  I have been anxious or worried for no good reason. 1  I have felt scared or panicky for no good reason. 0  Things have been getting on top of me. 1  I have been so unhappy that I have had difficulty sleeping. 0  I have felt sad or miserable. 0  I have been so unhappy that I have been crying. 0  The thought of harming myself has occurred to me. 0  Edinburgh Postnatal Depression Scale Total 3    Risk assessment for postpartum VTE and prophylactic treatment: Very high risk factors: If any risk factors: 6 weeks LMHW and BMI > 50 kg/m2  Postpartum VTE prophylaxis with LMWH  indicated  After visit meds:  Allergies as of 10/26/2023       Reactions   Oxycodone-acetaminophen Nausea And Vomiting        Medication List     STOP taking these medications    aspirin EC 81 MG tablet   insulin NPH Human 100 UNIT/ML injection Commonly known as: NOVOLIN N   insulin regular 100 units/mL injection Commonly known as: NOVOLIN R       TAKE these medications    coconut oil Oil Apply 1 Application topically as needed.   dibucaine 1 % Oint Commonly known as: NUPERCAINAL Place 1 Application rectally as needed for hemorrhoids.   enoxaparin 40 MG/0.4ML injection Commonly known as: LOVENOX Inject 0.4 mLs (40 mg total) into the skin daily. Start taking on: October 27, 2023   ferrous sulfate 325 (65 FE) MG tablet Take 1 tablet (325 mg total) by mouth 2 (two) times daily with a meal.   HYDROcodone-acetaminophen 5-325 MG tablet Commonly known as: NORCO/VICODIN Take 1-2 tablets by mouth every 4 (four) hours as needed for up to 7 days for moderate pain (pain score 4-6).   ibuprofen 600 MG tablet Commonly known as: ADVIL Take 1 tablet (600 mg total) by mouth every 6 (six) hours.   levothyroxine 50 MCG tablet Commonly known as: SYNTHROID Take 1 tablet (50 mcg total) by mouth daily at 6 (six) AM. Start taking on: October 27, 2023 What  changed: when to take this   metFORMIN 500 MG tablet Commonly known as: GLUCOPHAGE Take 1 tablet (500 mg total) by mouth daily with supper for 6 days, THEN 2 tablets (1,000 mg total) 2 (two) times daily with a meal. Start taking on: October 26, 2023   prenatal multivitamin Tabs tablet Take 1 tablet by mouth daily.   senna-docusate 8.6-50 MG tablet Commonly known as: Senokot-S Take 2 tablets by mouth daily. Start taking on: October 27, 2023   simethicone 80 MG chewable tablet Commonly known as: MYLICON Chew 1 tablet (80 mg total) by mouth 3 (three) times daily after meals.   witch hazel-glycerin pad Commonly known as: TUCKS Apply 1 Application topically as needed for hemorrhoids.  Discharge home in stable condition Infant Feeding: Breast Infant Disposition:home with mother Discharge instruction: per After Visit Summary and Postpartum booklet. Activity: Advance as tolerated. Pelvic rest for 6 weeks.  Diet: carb modified diet Anticipated Birth Control:  Contraceptives: None and patient is catholic  Postpartum Appointment:6 weeks Additional Postpartum F/U: Incision check 1 week Future Appointments:No future appointments. Follow up Visit:  Follow-up Information     Conard Novak, MD. Go on 11/04/2023.   Specialty: Obstetrics and Gynecology Why: For incision check, Post-op follow up, (Keep previously scheduled appointment) Contact information: [address] [place] [address] [phone]                 Plan:  KERRI-ANNE HEAGNEY was discharged to home in good condition. Follow-up appointment as directed.    Signed: Chari Manning CNM

## 2023-10-25 LAB — CBC
HCT: 30.9 % — ABNORMAL LOW (ref 36.0–46.0)
Hemoglobin: 10.7 g/dL — ABNORMAL LOW (ref 12.0–15.0)
MCH: 31.6 pg (ref 26.0–34.0)
MCHC: 34.6 g/dL (ref 30.0–36.0)
MCV: 91.2 fL (ref 80.0–100.0)
Platelets: 147 10*3/uL — ABNORMAL LOW (ref 150–400)
RBC: 3.39 MIL/uL — ABNORMAL LOW (ref 3.87–5.11)
RDW: 14.8 % (ref 11.5–15.5)
WBC: 10.2 10*3/uL (ref 4.0–10.5)
nRBC: 0 % (ref 0.0–0.2)

## 2023-10-25 LAB — CREATININE, SERUM
Creatinine, Ser: 0.58 mg/dL (ref 0.44–1.00)
GFR, Estimated: >60 mL/min (ref >60)

## 2023-10-25 LAB — GLUCOSE, CAPILLARY
Glucose-Capillary: 131 mg/dL — ABNORMAL HIGH (ref 70–99)
Glucose-Capillary: 87 mg/dL (ref 70–99)
Glucose-Capillary: 80 mg/dL (ref 70–99)
Glucose-Capillary: 82 mg/dL (ref 70–99)
Glucose-Capillary: 89 mg/dL (ref 70–99)

## 2023-10-25 LAB — FETAL SCREEN: Fetal Screen: NEGATIVE

## 2023-10-25 MED ORDER — RHO D IMMUNE GLOBULIN 1500 UNIT/2ML IJ SOSY
300.0000 ug | PREFILLED_SYRINGE | Freq: Once | INTRAMUSCULAR | Status: AC
Start: 2023-10-25 — End: 2023-10-25
  Administered 2023-10-25: 300 ug via INTRAVENOUS
  Filled 2023-10-25: qty 2

## 2023-10-25 MED ORDER — INSULIN ASPART 100 UNIT/ML IJ SOLN: 0.0000 [IU] | Freq: Three times a day (TID) | INTRAMUSCULAR | Status: DC

## 2023-10-25 MED ORDER — METFORMIN HCL 500 MG PO TABS: 500.0000 mg | ORAL_TABLET | Freq: Two times a day (BID) | ORAL | Status: DC

## 2023-10-25 MED ORDER — SALINE SPRAY 0.65 % NA SOLN
1.0000 | NASAL | Status: DC | PRN
  Administered 2023-10-26: 1 via NASAL
  Filled 2023-10-25 (×2): qty 44

## 2023-10-25 MED ORDER — METFORMIN HCL 500 MG PO TABS
500.0000 mg | ORAL_TABLET | Freq: Every evening | ORAL | Status: DC
  Administered 2023-10-25: 500 mg via ORAL
  Filled 2023-10-25: qty 1

## 2023-10-25 MED ORDER — METFORMIN HCL 500 MG PO TABS: 500.0000 mg | ORAL_TABLET | Freq: Every evening | ORAL | Status: DC

## 2023-10-25 NOTE — Lactation Note (Signed)
This note was copied from a baby's chart. Lactation Consultation Note  Patient Name: Sabrina Richards [id number] Date: 10/25/2023 Age:41 hours Reason for consult: Follow-up assessment;Term   Maternal Data Follow up assessment w/ 29hr old baby Sabrina.  Patient stated to [facility] that she is not sure infant is getting enough at the breast.  Patient is also concerned because the latch does hurt.  Infant just ate prior to [facility] entering the room.  LC encourage patient to call out for lactation when it is time to feed infant again.   Feeding Mother's Current Feeding Choice: Breast Milk  No feeding observed during this time.  Interventions Interventions: Education  Consult Status Consult Status: Follow-up Follow-up type: In-patient    Yvette Rack Katessa Attridge 10/25/2023, 3:01 PM

## 2023-10-25 NOTE — Progress Notes (Signed)
Reviewed recommendations from the Inpatient Diabetes Coordinator with Dr. Algis Downs. Schermerhorn. Per Dr. Algis Downs Schermerhorn, patient does not need insulin coverage at this time and should continue Metformin 500mg  every evening and if tolerated, increase to Metformin 500mg  BID in 1 week.  Janyce Llanos, CNM 10/25/2023 10:27 AM

## 2023-10-25 NOTE — Inpatient Diabetes Management (Signed)
Inpatient Diabetes Program Recommendations  AACE/ADA: New Consensus Statement on Inpatient Glycemic Control  Target Ranges:  Prepandial:   less than 140 mg/dL      Peak postprandial:   less than 180 mg/dL (1-2 hours)      Critically ill patients:  140 - 180 mg/dL    Latest Reference Range & Units 10/24/23 06:30 10/24/23 11:09 10/24/23 13:13 10/24/23 17:16 10/24/23 21:06 10/25/23 06:22  Glucose-Capillary 70 - 99 mg/dL 81 79 161 (H) 096 (H) 045 (H) 131 (H)   Review of Glycemic Control  Diabetes history: DM2 hx Outpatient Diabetes medications: Novolin N 30 units QAM, Novolin N 18 units QPM, Novolin R 15 units QAM, Novolin R 12 units QPM; DM diet controlled prior to pregnancy Current orders for Inpatient glycemic control: CBGs  Inpatient Diabetes Program Recommendations:    Insulin: Please consider ordering CBGs AC&HS and Novolog 0-9 units TID with meals while inpatient.  Outpatient DM: Would recommend to start patient on Metformin XR 500 mg BID inpatient and also discharge on same and encourage patient to follow up with PCP regarding DM control.  NOTE: Noted consult for diabetes coordinator for medication recommendations. Per chart, patient has DM2 hx (dx 02/27/23) which was diet controlled prior to pregnancy. Patient was most currently taking Novolin N and R as noted above for diabetes control during pregnancy. Patient had c-section on 10/24/23. Noted patient received Decadron 10 mg at 8:06 am on 10/24/23 which is contributing to hyperglycemia.   Thanks, Orlando Penner, RN, MSN, CDCES Diabetes Coordinator Inpatient Diabetes Program [phone] (Team Pager from 8am to 5pm)

## 2023-10-25 NOTE — Lactation Note (Signed)
This note was copied from a baby's chart. Lactation Consultation Note  Patient Name: Sabrina Richards [id number] Date: 10/25/2023 Age:41 hours Reason for consult: Follow-up assessment;Mother's request;Term;Breastfeeding assistance   Maternal Data Patient called lactation for breastfeeding assistance.   Feeding Mother's Current Feeding Choice: Breast Milk and Formula Nipple Type: Slow - flow  Infant fed on the rt breast in football hold.  Infant latches shallow and has a very strong suck.  Patient endorses pain.  Patients nipples are red w/ compression strikes. LC provided suggestions to help maximize a deep latch and offered suggestions for nipple care.  LATCH Score Latch: Repeated attempts needed to sustain latch, nipple held in mouth throughout feeding, stimulation needed to elicit sucking reflex.  Audible Swallowing: A few with stimulation  Type of Nipple: Everted at rest and after stimulation  Comfort (Breast/Nipple): Filling, red/small blisters or bruises, mild/mod discomfort (Compression strikes both nipples)  Hold (Positioning): No assistance needed to correctly position infant at breast.  LATCH Score: 7   Lactation Tools Discussed/Used Patient was provided 2 hydrogels and sore nipple breast shells from damage to nipples.   Interventions Interventions: Assisted with latch;Breast compression;Adjust position;Position options;Shells;Education (Hydrogels (2))  LC discussed options for nipple care that involved; hand expression on milk, soft shells, hydrogels, comfort gels and using coconut oil.   Consult Status Consult Status: Follow-up Date: 10/26/23 Follow-up type: In-patient    Sabrina Richards 10/25/2023, 4:20 PM

## 2023-10-25 NOTE — Progress Notes (Addendum)
Post Operative Day 1 Subjective: Doing well, no complaints.  Tolerating regular diet, pain with PO meds, voiding and ambulating without difficulty.  No CP SOB Fever,Chills, N/V or leg pain; denies nipple or breast pain, no HA change of vision, RUQ/epigastric pain  Objective: BP (!) 95/53 (BP Location: Left Arm) Comment: nurse Kierra notified  Pulse 68   Temp 97.9 F (36.6 C) (Oral)   Resp 19   Ht 5' (1.524 m)   Wt 122.5 kg   LMP 01/24/2023   SpO2 96%   Breastfeeding Unknown   BMI 52.73 kg/m    Physical Exam:  General: NAD Breasts: soft/nontender  CV: RRR Pulm: nl effort, CTABL Abdomen: soft, NT, BS x 4 Incision: Dsg CDI, vacuum dressing in place and occlusive Lochia: moderate Uterine Fundus: fundus firm and 2 fb below umbilicus DVT Evaluation: no cords, ttp LEs   Recent Labs    10/24/23 0520 10/25/23 0612  HGB 13.4 10.7*  HCT 38.0 30.9*  WBC 6.2 10.2  PLT 153 147*   CBG (last 3)  Recent Labs    10/24/23 1716 10/24/23 2106 10/25/23 0622  GLUCAP 169* 138* 131*    Assessment/Plan: 42 y.o. [id number] postpartum/post-operative day # 1  - Continue routine PP care - Lactation consult PRN  - Acute blood loss anemia, clinically significant - hemoglobin changed from 13.4 to 10.7, patient is asymptomatic, hemodynamically stable; continue po ferrous sulfate BID with stool softeners - T2DM: her blood sugars have been elevated since she started eating after delivery. She was not on any medication prior to pregnancy and was diet-controlled. In pregnancy she was taking insulin NPH 30 units in AM and 12 units in PM, Reg 15 units in AM and 12 units in PM and her blood sugars were well controlled. She reports since giving birth she gave herself a "cheat day" yesterday so was not eating her typical diabetes diet and was eating carbohydrates and desserts. She reports she will follow her diet better today. Discussed with Dr. Algis Downs Schermerhorn and patient to start Metformin 500mg  q  evening. CBG monitoring changed to AC/HS. Consult placed for the diabetes coordinator for recommendations for possible medications.  - Immunization status: Needs MMR prior to DC   Disposition: Does not desire Dc home today.   Janyce Llanos, CNM 10/25/2023 9:07 AM

## 2023-10-25 NOTE — Anesthesia Postprocedure Evaluation (Signed)
Anesthesia Post Note  Patient: Sabrina Richards  Procedure(s) Performed: REPEAT CESAREAN SECTION  Patient location during evaluation: Mother Baby Anesthesia Type: Spinal Level of consciousness: oriented and awake and alert Pain management: pain level controlled Vital Signs Assessment: post-procedure vital signs reviewed and stable Respiratory status: spontaneous breathing and respiratory function stable Cardiovascular status: blood pressure returned to baseline and stable Postop Assessment: no headache, no backache, no apparent nausea or vomiting and able to ambulate Anesthetic complications: no   No notable events documented.   Last Vitals:  Vitals:   10/25/23 0200 10/25/23 0813  BP:  (!) 95/53  Pulse: 72 68  Resp:  19  Temp:  36.6 C  SpO2: 95% 96%    Last Pain:  Vitals:   10/25/23 0930  TempSrc:   PainSc: 1                  Reed Breech

## 2023-10-26 LAB — RHOGAM INJECTION: Unit division: 0

## 2023-10-26 LAB — GLUCOSE, CAPILLARY: Glucose-Capillary: 96 mg/dL (ref 70–99)

## 2023-10-26 MED ORDER — HYDROCODONE-ACETAMINOPHEN 5-325 MG PO TABS
1.0000 | ORAL_TABLET | ORAL | 0 refills | Status: AC | PRN
Start: 2023-10-26 — End: 2023-11-02

## 2023-10-26 MED ORDER — WITCH HAZEL-GLYCERIN EX PADS: 1.0000 | MEDICATED_PAD | CUTANEOUS | Status: DC | PRN

## 2023-10-26 MED ORDER — LEVOTHYROXINE SODIUM 50 MCG PO TABS: 50.0000 ug | ORAL_TABLET | Freq: Every day | ORAL | 0 refills | Status: AC

## 2023-10-26 MED ORDER — DIBUCAINE (PERIANAL) 1 % EX OINT: 1.0000 | TOPICAL_OINTMENT | CUTANEOUS | Status: DC | PRN

## 2023-10-26 MED ORDER — ENOXAPARIN SODIUM 40 MG/0.4ML IJ SOSY: 40.0000 mg | PREFILLED_SYRINGE | INTRAMUSCULAR | 0 refills | Status: DC

## 2023-10-26 MED ORDER — HYDROCODONE-ACETAMINOPHEN 5-325 MG PO TABS: 1.0000 | ORAL_TABLET | ORAL | 0 refills | Status: DC | PRN

## 2023-10-26 MED ORDER — METFORMIN HCL 500 MG PO TABS
ORAL_TABLET | ORAL | 0 refills | Status: DC
Start: 2023-10-26 — End: 2024-07-07

## 2023-10-26 MED ORDER — SIMETHICONE 80 MG PO CHEW: 80.0000 mg | CHEWABLE_TABLET | Freq: Three times a day (TID) | ORAL | Status: DC

## 2023-10-26 MED ORDER — IBUPROFEN 600 MG PO TABS: 600.0000 mg | ORAL_TABLET | Freq: Four times a day (QID) | ORAL | 0 refills | Status: DC

## 2023-10-26 MED ORDER — SENNOSIDES-DOCUSATE SODIUM 8.6-50 MG PO TABS: 2.0000 | ORAL_TABLET | ORAL | Status: DC

## 2023-10-26 MED ORDER — COCONUT OIL OIL: 1.0000 | TOPICAL_OIL | Status: DC | PRN

## 2023-10-26 MED ORDER — FERROUS SULFATE 325 (65 FE) MG PO TABS: 325.0000 mg | ORAL_TABLET | Freq: Two times a day (BID) | ORAL | Status: DC

## 2023-10-26 NOTE — Plan of Care (Signed)
  Problem: Education: Goal: Knowledge of General Education information will improve Description: Including pain rating scale, medication(s)/side effects and non-pharmacologic comfort measures Outcome: Progressing   Problem: Clinical Measurements: Goal: Ability to maintain clinical measurements within normal limits will improve Outcome: Progressing   Problem: Activity: Goal: Risk for activity intolerance will decrease Outcome: Progressing   Problem: Nutrition: Goal: Adequate nutrition will be maintained Outcome: Progressing   Problem: Pain Management: Goal: General experience of comfort will improve Outcome: Progressing

## 2023-10-26 NOTE — Discharge Instructions (Addendum)
If any urgent needs go to emergency room. Call OB if any questions or concerns.

## 2023-10-26 NOTE — Lactation Note (Signed)
This note was copied from a baby's chart. Lactation Consultation Note  Patient Name: Sabrina Richards [id number] Date: 10/26/2023 Age:41 hours Reason for consult: Follow-up assessment;Term;Maternal discharge   Maternal Data Has patient been taught Hand Expression?: Yes Does the patient have breastfeeding experience prior to this delivery?: Yes How long did the patient breastfeed?: 1 yr  Feeding Mother's Current Feeding Choice: Breast Milk and Formula Nipple Type: Slow - flow I did not observe a feeding, baby in nsy getting a circumcision before discharge, mom states they have been supplementing occ but baby taking less formula and hearing more swallows at the breast, mom states that nipple tenderness is decreased and breastfeeding is going better  LATCH Score Latch:  (no feeding observed)                  Lactation Tools Discussed/Used    Interventions Interventions: Education LC name updated on white board  Discharge Pump: Personal WIC Program: No  Consult Status Consult Status: PRN Date: 10/26/23 Follow-up type: In-patient    Dyann Kief 10/26/2023, 10:50 AM

## 2023-10-26 NOTE — Progress Notes (Signed)
Discharge instructions reviewed with patient and significant other.  Printed copies given to patient for reference after discharge home.  Questions answered and follow up care reviewed.

## 2023-11-11 ENCOUNTER — Encounter: Payer: Self-pay | Admitting: Obstetrics and Gynecology

## 2024-07-07 ENCOUNTER — Ambulatory Visit (INDEPENDENT_AMBULATORY_CARE_PROVIDER_SITE_OTHER)

## 2024-07-07 ENCOUNTER — Ambulatory Visit: Admitting: Podiatry

## 2024-07-07 ENCOUNTER — Encounter: Payer: Self-pay | Admitting: Podiatry

## 2024-07-07 DIAGNOSIS — M722 Plantar fascial fibromatosis: Secondary | ICD-10-CM

## 2024-07-07 MED ORDER — METHYLPREDNISOLONE 4 MG PO TBPK: ORAL_TABLET | ORAL | 0 refills | Status: AC

## 2024-07-07 MED ORDER — MELOXICAM 15 MG PO TABS: 15.0000 mg | ORAL_TABLET | Freq: Every day | ORAL | 1 refills | Status: AC

## 2024-07-21 DIAGNOSIS — M722 Plantar fascial fibromatosis: Secondary | ICD-10-CM | POA: Diagnosis not present

## 2024-07-21 MED ORDER — BETAMETHASONE SOD PHOS & ACET 6 (3-3) MG/ML IJ SUSP
3.0000 mg | Freq: Once | INTRAMUSCULAR | Status: AC
  Administered 2024-07-21: 3 mg via INTRA_ARTICULAR

## 2024-07-21 NOTE — Progress Notes (Signed)
   Chief Complaint  Patient presents with   Foot Pain    Pt is here due to right foot and ankle pain, states the pain started a few weeks ago, it hurts when she stands and gradually goes away with movement, states sometimes a sharp pain goes through her foot and up the ankle that pain comes and goes, recently started wearing new shoes and has help some with the pain.    Subjective: 43 y.o. female presenting for above complaints   Past Medical History:  Diagnosis Date   [redacted] weeks gestation of pregnancy 03/22/2016   Gestational diabetes    High-risk pregnancy in third trimester 03/22/2016   Hypothyroidism    Labor and delivery, indication for care 03/24/2016   PONV (postoperative nausea and vomiting)    Postpartum care following cesarean delivery 03/22/2016   Thyroid  disease     Past Surgical History:  Procedure Laterality Date   ARM WOUND REPAIR / CLOSURE     Repair of dog bite   CESAREAN SECTION     CESAREAN SECTION N/A 03/22/2016   Procedure: CESAREAN SECTION;  Surgeon: Mitzie BROCKS Ward, MD;  Location: [facility];  Service: Obstetrics;  Laterality: N/A;   CESAREAN SECTION N/A 10/24/2023   Procedure: REPEAT CESAREAN SECTION;  Surgeon: Leonce Garnette BIRCH, MD;  Location: [facility];  Service: Obstetrics;  Laterality: N/A;   ORIF SCAPHOID FRACTURE Left 11/17/2019   Procedure: EXCISION / DEBRIDEMENT WITH BONE GRAFTING OF AN INTRAOSSEOUS CYST OF LEFT SCAPHOID;  Surgeon: Edie Norleen PARAS, MD;  Location: [facility];  Service: Orthopedics;  Laterality: Left;    Objective: Physical Exam General: The patient is alert and oriented x3 in no acute distress.  Dermatology: Skin is warm, dry and supple bilateral lower extremities. Negative for open lesions or macerations bilateral.   Vascular: Dorsalis Pedis and Posterior Tibial pulses palpable bilateral.  Capillary fill time is immediate to all digits.  Neurological: Grossly intact via light touch  Musculoskeletal: Tenderness to palpation to the  plantar aspect of the right heel along the plantar fascia. All other joints range of motion within normal limits bilateral. Strength 5/5 in all groups bilateral.  There is also some tenderness with palpation to the tibiotalar joint right.  No crepitus with range of motion  Radiographic exam RT foot and ankle 07/07/2024: Normal osseous mineralization. Joint spaces preserved. No fracture/dislocation/boney destruction. No other soft tissue abnormalities or radiopaque foreign bodies.   Assessment: 1. Plantar fasciitis right 2.  Capsulitis/synovitis right ankle  Plan of Care:  -Patient evaluated. Xrays reviewed.   -Injection of 0.5cc Celestone  soluspan injected into the right plantar fascia  -Prescription for Medrol  Dosepak -Prescription for meloxicam  15 mg daily after completion of the Dosepak -Advised against going barefoot.  Recommended supportive tennis shoes and sneakers -Recommend daily calf stretching, specifically calf stretches to alleviate posterior calf tightness -Return to clinic 4 weeks   Thresa EMERSON Sar, DPM [facility]  Dr. Thresa EMERSON Sar, DPM    [address] [place], [place] [address]                Office [phone]  Fax [phone]

## 2024-08-04 ENCOUNTER — Ambulatory Visit: Admitting: Podiatry
